# Patient Record
Sex: Female | Born: 1955
Health system: Southern US, Community
[De-identification: ages and names within clinical notes are randomized; demographics above are authoritative.]

## PROBLEM LIST (undated history)

## (undated) DIAGNOSIS — J309 Allergic rhinitis, unspecified: Secondary | ICD-10-CM

## (undated) DIAGNOSIS — E079 Disorder of thyroid, unspecified: Secondary | ICD-10-CM

## (undated) DIAGNOSIS — J45909 Unspecified asthma, uncomplicated: Secondary | ICD-10-CM

## (undated) DIAGNOSIS — I10 Essential (primary) hypertension: Secondary | ICD-10-CM

## (undated) HISTORY — PX: ABDOMINAL HYSTERECTOMY: SHX81

## (undated) HISTORY — DX: Allergic rhinitis, unspecified: J30.9

## (undated) HISTORY — DX: Unspecified asthma, uncomplicated: J45.909

## (undated) HISTORY — PX: JOINT REPLACEMENT: SHX530

---

## 2003-07-10 ENCOUNTER — Encounter: Admission: RE | Admit: 2003-07-10 | Discharge: 2003-10-08 | Payer: Self-pay | Admitting: Internal Medicine

## 2008-06-20 ENCOUNTER — Encounter: Admission: RE | Admit: 2008-06-20 | Discharge: 2008-06-20 | Payer: Self-pay | Admitting: Obstetrics and Gynecology

## 2009-07-25 ENCOUNTER — Encounter: Admission: RE | Admit: 2009-07-25 | Discharge: 2009-07-25 | Payer: Self-pay | Admitting: Obstetrics and Gynecology

## 2010-08-07 ENCOUNTER — Encounter
Admission: RE | Admit: 2010-08-07 | Discharge: 2010-08-07 | Payer: Self-pay | Source: Home / Self Care | Attending: Obstetrics and Gynecology | Admitting: Obstetrics and Gynecology

## 2010-09-14 ENCOUNTER — Encounter: Payer: Self-pay | Admitting: Obstetrics and Gynecology

## 2011-12-02 ENCOUNTER — Encounter (HOSPITAL_BASED_OUTPATIENT_CLINIC_OR_DEPARTMENT_OTHER): Payer: Self-pay | Admitting: *Deleted

## 2011-12-02 ENCOUNTER — Emergency Department (HOSPITAL_BASED_OUTPATIENT_CLINIC_OR_DEPARTMENT_OTHER)
Admission: EM | Admit: 2011-12-02 | Discharge: 2011-12-02 | Disposition: A | Discharge status: Home / Self Care | Payer: No Typology Code available for payment source | Attending: Emergency Medicine | Admitting: Emergency Medicine

## 2011-12-02 DIAGNOSIS — S339XXA Sprain of unspecified parts of lumbar spine and pelvis, initial encounter: Secondary | ICD-10-CM | POA: Insufficient documentation

## 2011-12-02 DIAGNOSIS — E119 Type 2 diabetes mellitus without complications: Secondary | ICD-10-CM | POA: Insufficient documentation

## 2011-12-02 DIAGNOSIS — I10 Essential (primary) hypertension: Secondary | ICD-10-CM | POA: Insufficient documentation

## 2011-12-02 DIAGNOSIS — Y93I9 Activity, other involving external motion: Secondary | ICD-10-CM | POA: Insufficient documentation

## 2011-12-02 DIAGNOSIS — S39012A Strain of muscle, fascia and tendon of lower back, initial encounter: Secondary | ICD-10-CM

## 2011-12-02 DIAGNOSIS — Y998 Other external cause status: Secondary | ICD-10-CM | POA: Insufficient documentation

## 2011-12-02 DIAGNOSIS — E079 Disorder of thyroid, unspecified: Secondary | ICD-10-CM | POA: Insufficient documentation

## 2011-12-02 HISTORY — DX: Disorder of thyroid, unspecified: E07.9

## 2011-12-02 HISTORY — DX: Essential (primary) hypertension: I10

## 2011-12-02 MED ORDER — IBUPROFEN 800 MG PO TABS
800.0000 mg | ORAL_TABLET | Freq: Once | ORAL | Status: AC
  Administered 2011-12-02: 800 mg via ORAL
  Filled 2011-12-02: qty 1

## 2011-12-02 MED ORDER — IBUPROFEN 800 MG PO TABS: 800.0000 mg | ORAL_TABLET | Freq: Three times a day (TID) | ORAL | Status: AC

## 2011-12-02 MED ORDER — TRAMADOL HCL 50 MG PO TABS: 50.0000 mg | ORAL_TABLET | Freq: Four times a day (QID) | ORAL | Status: AC | PRN

## 2011-12-02 MED ORDER — OXYCODONE-ACETAMINOPHEN 5-325 MG PO TABS
2.0000 | ORAL_TABLET | Freq: Once | ORAL | Status: DC
  Filled 2011-12-02: qty 2

## 2011-12-02 MED ORDER — CYCLOBENZAPRINE HCL 10 MG PO TABS: 10.0000 mg | ORAL_TABLET | Freq: Three times a day (TID) | ORAL | Status: AC | PRN

## 2011-12-02 MED ORDER — ALUM & MAG HYDROXIDE-SIMETH 200-200-20 MG/5ML PO SUSP
30.0000 mL | Freq: Once | ORAL | Status: AC
  Administered 2011-12-02: 30 mL via ORAL
  Filled 2011-12-02: qty 30
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
## 2011-12-02 NOTE — Discharge Instructions (Signed)
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
## 2012-04-01 ENCOUNTER — Other Ambulatory Visit: Payer: Self-pay | Admitting: Obstetrics and Gynecology

## 2012-04-01 DIAGNOSIS — Z1231 Encounter for screening mammogram for malignant neoplasm of breast: Secondary | ICD-10-CM

## 2012-04-07 ENCOUNTER — Other Ambulatory Visit (HOSPITAL_COMMUNITY)
Admission: RE | Admit: 2012-04-07 | Discharge: 2012-04-07 | Disposition: A | Discharge status: Home / Self Care | Payer: 59 | Source: Ambulatory Visit | Attending: Obstetrics and Gynecology | Admitting: Obstetrics and Gynecology

## 2012-04-07 DIAGNOSIS — R1909 Other intra-abdominal and pelvic swelling, mass and lump: Secondary | ICD-10-CM | POA: Insufficient documentation

## 2012-04-27 ENCOUNTER — Ambulatory Visit
Admission: RE | Admit: 2012-04-27 | Discharge: 2012-04-27 | Disposition: A | Discharge status: Home / Self Care | Payer: 59 | Source: Ambulatory Visit | Attending: Obstetrics and Gynecology | Admitting: Obstetrics and Gynecology

## 2012-04-27 DIAGNOSIS — Z1231 Encounter for screening mammogram for malignant neoplasm of breast: Secondary | ICD-10-CM

## 2013-07-31 ENCOUNTER — Other Ambulatory Visit: Payer: Self-pay | Admitting: Obstetrics and Gynecology

## 2013-07-31 DIAGNOSIS — Z780 Asymptomatic menopausal state: Secondary | ICD-10-CM

## 2013-07-31 DIAGNOSIS — Z1231 Encounter for screening mammogram for malignant neoplasm of breast: Secondary | ICD-10-CM

## 2013-09-04 ENCOUNTER — Ambulatory Visit
Admission: RE | Admit: 2013-09-04 | Discharge: 2013-09-04 | Disposition: A | Discharge status: Home / Self Care | Payer: 59 | Source: Ambulatory Visit | Attending: Obstetrics and Gynecology | Admitting: Obstetrics and Gynecology

## 2013-09-04 DIAGNOSIS — Z780 Asymptomatic menopausal state: Secondary | ICD-10-CM

## 2013-09-04 DIAGNOSIS — Z1231 Encounter for screening mammogram for malignant neoplasm of breast: Secondary | ICD-10-CM

## 2015-05-22 ENCOUNTER — Ambulatory Visit (INDEPENDENT_AMBULATORY_CARE_PROVIDER_SITE_OTHER): Payer: 59 | Admitting: *Deleted

## 2015-05-22 DIAGNOSIS — J309 Allergic rhinitis, unspecified: Secondary | ICD-10-CM | POA: Diagnosis not present

## 2015-05-22 MED ORDER — EPINEPHRINE 0.3 MG/0.3ML IJ SOAJ: 0.3000 mg | Freq: Once | INTRAMUSCULAR | Status: DC

## 2015-05-30 ENCOUNTER — Ambulatory Visit (INDEPENDENT_AMBULATORY_CARE_PROVIDER_SITE_OTHER): Payer: Self-pay

## 2015-05-30 DIAGNOSIS — J309 Allergic rhinitis, unspecified: Secondary | ICD-10-CM

## 2015-06-20 ENCOUNTER — Ambulatory Visit (INDEPENDENT_AMBULATORY_CARE_PROVIDER_SITE_OTHER): Payer: 59 | Admitting: *Deleted

## 2015-06-20 DIAGNOSIS — J309 Allergic rhinitis, unspecified: Secondary | ICD-10-CM | POA: Diagnosis not present

## 2015-07-04 ENCOUNTER — Ambulatory Visit (INDEPENDENT_AMBULATORY_CARE_PROVIDER_SITE_OTHER): Payer: 59 | Admitting: *Deleted

## 2015-07-04 DIAGNOSIS — J309 Allergic rhinitis, unspecified: Secondary | ICD-10-CM | POA: Diagnosis not present

## 2015-07-16 ENCOUNTER — Ambulatory Visit (INDEPENDENT_AMBULATORY_CARE_PROVIDER_SITE_OTHER): Payer: 59 | Admitting: *Deleted

## 2015-07-16 DIAGNOSIS — J309 Allergic rhinitis, unspecified: Secondary | ICD-10-CM | POA: Diagnosis not present

## 2015-08-05 ENCOUNTER — Ambulatory Visit (INDEPENDENT_AMBULATORY_CARE_PROVIDER_SITE_OTHER): Payer: 59

## 2015-08-05 DIAGNOSIS — J309 Allergic rhinitis, unspecified: Secondary | ICD-10-CM

## 2015-08-20 ENCOUNTER — Other Ambulatory Visit: Payer: Self-pay

## 2015-08-20 DIAGNOSIS — Z1231 Encounter for screening mammogram for malignant neoplasm of breast: Secondary | ICD-10-CM

## 2015-08-29 ENCOUNTER — Ambulatory Visit (INDEPENDENT_AMBULATORY_CARE_PROVIDER_SITE_OTHER): Payer: 59 | Admitting: *Deleted

## 2015-08-29 DIAGNOSIS — J309 Allergic rhinitis, unspecified: Secondary | ICD-10-CM

## 2015-09-12 ENCOUNTER — Ambulatory Visit (INDEPENDENT_AMBULATORY_CARE_PROVIDER_SITE_OTHER): Payer: 59 | Admitting: *Deleted

## 2015-09-12 DIAGNOSIS — J309 Allergic rhinitis, unspecified: Secondary | ICD-10-CM | POA: Diagnosis not present

## 2015-09-19 ENCOUNTER — Ambulatory Visit (INDEPENDENT_AMBULATORY_CARE_PROVIDER_SITE_OTHER): Payer: 59 | Admitting: *Deleted

## 2015-09-19 DIAGNOSIS — J309 Allergic rhinitis, unspecified: Secondary | ICD-10-CM | POA: Diagnosis not present

## 2015-09-26 ENCOUNTER — Ambulatory Visit (INDEPENDENT_AMBULATORY_CARE_PROVIDER_SITE_OTHER): Payer: 59 | Admitting: *Deleted

## 2015-09-26 DIAGNOSIS — J309 Allergic rhinitis, unspecified: Secondary | ICD-10-CM | POA: Diagnosis not present

## 2015-10-08 ENCOUNTER — Ambulatory Visit (INDEPENDENT_AMBULATORY_CARE_PROVIDER_SITE_OTHER): Payer: 59

## 2015-10-08 DIAGNOSIS — J309 Allergic rhinitis, unspecified: Secondary | ICD-10-CM | POA: Diagnosis not present

## 2015-10-24 ENCOUNTER — Ambulatory Visit (INDEPENDENT_AMBULATORY_CARE_PROVIDER_SITE_OTHER): Payer: 59

## 2015-10-24 DIAGNOSIS — J309 Allergic rhinitis, unspecified: Secondary | ICD-10-CM

## 2015-10-25 ENCOUNTER — Encounter: Payer: Self-pay | Admitting: Internal Medicine

## 2015-10-25 ENCOUNTER — Ambulatory Visit (INDEPENDENT_AMBULATORY_CARE_PROVIDER_SITE_OTHER): Payer: 59 | Admitting: Internal Medicine

## 2015-10-25 VITALS — BP 148/100 | HR 84 | Temp 98.7°F | Resp 16 | Ht 64.17 in | Wt 183.0 lb

## 2015-10-25 DIAGNOSIS — J3089 Other allergic rhinitis: Secondary | ICD-10-CM

## 2015-10-25 DIAGNOSIS — J309 Allergic rhinitis, unspecified: Secondary | ICD-10-CM | POA: Insufficient documentation

## 2015-10-25 DIAGNOSIS — J4520 Mild intermittent asthma, uncomplicated: Secondary | ICD-10-CM

## 2015-10-25 DIAGNOSIS — J4530 Mild persistent asthma, uncomplicated: Secondary | ICD-10-CM | POA: Insufficient documentation

## 2015-10-25 MED ORDER — ALBUTEROL SULFATE HFA 108 (90 BASE) MCG/ACT IN AERS: 2.0000 | INHALATION_SPRAY | RESPIRATORY_TRACT | Status: DC | PRN

## 2015-10-25 MED ORDER — FLUNISOLIDE 25 MCG/ACT (0.025%) NA SOLN: NASAL | Status: DC

## 2015-10-25 NOTE — Assessment & Plan Note (Signed)
   On immunotherapy, currently not well controlled  Use Claritin (loratadine) 10 mg daily and start flunisolide one to 2 sprays each nostril daily on a regular basis for the next few weeks  If doing better after that, may try as needed  Has EpiPen and action plan-educated on use

## 2015-10-25 NOTE — Patient Instructions (Signed)
Allergic rhinitis  On immunotherapy, currently not well controlled  Use Claritin (loratadine) 10 mg daily and start flunisolide one to 2 sprays each nostril daily on a regular basis for the next few weeks  If doing better after that, may try as needed  Has EpiPen and action plan-educated on use  Asthma  Intermittent, currently well controlled  Continue as needed Ventolin

## 2015-10-25 NOTE — Progress Notes (Signed)
History of Present Illness: Robin Mcclain is a 60 y.o. female presenting for follow-up.  HPI Comments: Allergic rhinitis on immunotherapy: Start 11/21/12. She is very close to maintenance. She is noticing symptom improvement with decreased infection since starting her injections. Occasionally she has large local reactions but no systemic reactions. For the past several weeks, she has been having headache, rhinorrhea.  She only uses Claritin on the day of her injection.  Asthma: Since her last visit symptoms have been stable. She rarely requires albuterol.   Current Outpatient Prescriptions on File Prior to Visit  Medication Sig Dispense Refill  . albuterol (VENTOLIN HFA) 108 (90 BASE) MCG/ACT inhaler Inhale 2 puffs into the lungs every 4 (four) hours as needed for wheezing or shortness of breath.    Marland Kitchen aspirin 81 MG tablet Take 81 mg by mouth daily.    Marland Kitchen dicyclomine (BENTYL) 10 MG capsule Take 10 mg by mouth 4 (four) times daily -  before meals and at bedtime.    Marland Kitchen EPINEPHrine (EPIPEN 2-PAK) 0.3 mg/0.3 mL IJ SOAJ injection Inject 0.3 mLs (0.3 mg total) into the muscle once. 1 Device 1  . levothyroxine (SYNTHROID, LEVOTHROID) 75 MCG tablet Take 75 mcg by mouth daily.    Marland Kitchen loratadine (CLARITIN) 10 MG tablet Take 10 mg by mouth daily.    . metFORMIN (GLUCOPHAGE) 1000 MG tablet Take 1,000 mg by mouth 2 (two) times daily with a meal.    . olmesartan (BENICAR) 40 MG tablet Take 40 mg by mouth daily.    . rosuvastatin (CRESTOR) 10 MG tablet Take 10 mg by mouth daily.     No current facility-administered medications on file prior to visit.    Assessment and Plan: Allergic rhinitis  On immunotherapy, currently not well controlled  Use Claritin (loratadine) 10 mg daily and start flunisolide one to 2 sprays each nostril daily on a regular basis for the next few weeks  If doing better after that, may try as needed  Has EpiPen and action plan-educated on use  Asthma  Intermittent, currently  well controlled  Continue as needed Ventolin   Return in about 1 year (around 10/24/2016).  Meds ordered this encounter  Medications  . DISCONTD: olmesartan-hydrochlorothiazide (BENICAR HCT) 40-25 MG tablet    Sig:   . glucose blood (FREESTYLE LITE) test strip    Sig:   . ibuprofen (ADVIL,MOTRIN) 800 MG tablet    Sig: Take 800 mg by mouth.  . ondansetron (ZOFRAN) 8 MG tablet    Sig: Take 8 mg by mouth.  Marland Kitchen glimepiride (AMARYL) 4 MG tablet    Sig: TK 1 T PO  BID    Refill:  2  . NONFORMULARY OR COMPOUNDED ITEM    Sig:     Diagnostics: Spirometry: FEV1 2L or 96%, FEV1/FVC  79%.  This is a normal study.  Physical Exam: BP 148/100 mmHg  Pulse 84  Temp(Src) 98.7 F (37.1 C) (Oral)  Resp 16  Ht 5' 4.17" (1.63 m)  Wt 182 lb 15.7 oz (83 kg)  BMI 31.24 kg/m2   Physical Exam  Constitutional: She appears well-developed and well-nourished. No distress.  HENT:  Right Ear: External ear normal.  Left Ear: External ear normal.  Nose: Nose normal.  Mouth/Throat: Oropharynx is clear and moist.  Eyes: Conjunctivae are normal. Right eye exhibits no discharge. Left eye exhibits no discharge.  Cardiovascular: Normal rate, regular rhythm and normal heart sounds.   No murmur heard. Pulmonary/Chest: Effort normal and breath sounds normal. No  respiratory distress. She has no wheezes. She has no rales.  Abdominal: Soft. Bowel sounds are normal.  Musculoskeletal: She exhibits no edema.  Lymphadenopathy:    She has no cervical adenopathy.  Neurological: She is alert.  Skin: No rash noted.  Vitals reviewed.   Drug Allergies:  Allergies  Allergen Reactions  . Lac Bovis   . Lipitor [Atorvastatin]     ROS: Per HPI unless specifically indicated below Review of Systems  Thank you for the opportunity to care for this patient.  Please do not hesitate to contact me with questions.

## 2015-10-25 NOTE — Assessment & Plan Note (Signed)
   Intermittent, currently well controlled  Continue as needed Ventolin

## 2015-10-28 ENCOUNTER — Encounter: Payer: Self-pay | Admitting: *Deleted

## 2015-10-31 ENCOUNTER — Ambulatory Visit (INDEPENDENT_AMBULATORY_CARE_PROVIDER_SITE_OTHER): Payer: 59

## 2015-10-31 DIAGNOSIS — J309 Allergic rhinitis, unspecified: Secondary | ICD-10-CM | POA: Diagnosis not present

## 2015-11-21 ENCOUNTER — Ambulatory Visit (INDEPENDENT_AMBULATORY_CARE_PROVIDER_SITE_OTHER): Payer: 59 | Admitting: *Deleted

## 2015-11-21 DIAGNOSIS — J309 Allergic rhinitis, unspecified: Secondary | ICD-10-CM

## 2015-11-27 ENCOUNTER — Ambulatory Visit (INDEPENDENT_AMBULATORY_CARE_PROVIDER_SITE_OTHER): Payer: 59 | Admitting: *Deleted

## 2015-11-27 DIAGNOSIS — J309 Allergic rhinitis, unspecified: Secondary | ICD-10-CM | POA: Diagnosis not present

## 2015-12-12 ENCOUNTER — Ambulatory Visit (INDEPENDENT_AMBULATORY_CARE_PROVIDER_SITE_OTHER): Payer: 59

## 2015-12-12 DIAGNOSIS — J309 Allergic rhinitis, unspecified: Secondary | ICD-10-CM

## 2015-12-18 ENCOUNTER — Ambulatory Visit (INDEPENDENT_AMBULATORY_CARE_PROVIDER_SITE_OTHER): Payer: 59

## 2015-12-18 DIAGNOSIS — J309 Allergic rhinitis, unspecified: Secondary | ICD-10-CM | POA: Diagnosis not present

## 2015-12-26 ENCOUNTER — Ambulatory Visit (INDEPENDENT_AMBULATORY_CARE_PROVIDER_SITE_OTHER): Payer: 59

## 2015-12-26 DIAGNOSIS — J309 Allergic rhinitis, unspecified: Secondary | ICD-10-CM

## 2015-12-27 DIAGNOSIS — J301 Allergic rhinitis due to pollen: Secondary | ICD-10-CM | POA: Diagnosis not present

## 2016-01-02 ENCOUNTER — Other Ambulatory Visit: Payer: Self-pay | Admitting: Obstetrics and Gynecology

## 2016-01-02 DIAGNOSIS — Z780 Asymptomatic menopausal state: Secondary | ICD-10-CM

## 2016-01-02 DIAGNOSIS — Z1231 Encounter for screening mammogram for malignant neoplasm of breast: Secondary | ICD-10-CM

## 2016-01-07 ENCOUNTER — Ambulatory Visit (INDEPENDENT_AMBULATORY_CARE_PROVIDER_SITE_OTHER): Payer: 59

## 2016-01-07 DIAGNOSIS — J309 Allergic rhinitis, unspecified: Secondary | ICD-10-CM | POA: Diagnosis not present

## 2016-01-16 ENCOUNTER — Ambulatory Visit (INDEPENDENT_AMBULATORY_CARE_PROVIDER_SITE_OTHER): Payer: 59

## 2016-01-16 ENCOUNTER — Other Ambulatory Visit: Payer: Self-pay

## 2016-01-16 ENCOUNTER — Ambulatory Visit: Payer: Self-pay

## 2016-01-16 DIAGNOSIS — J309 Allergic rhinitis, unspecified: Secondary | ICD-10-CM | POA: Diagnosis not present

## 2016-01-23 ENCOUNTER — Ambulatory Visit (INDEPENDENT_AMBULATORY_CARE_PROVIDER_SITE_OTHER): Payer: 59 | Admitting: *Deleted

## 2016-01-23 DIAGNOSIS — J309 Allergic rhinitis, unspecified: Secondary | ICD-10-CM

## 2016-01-28 ENCOUNTER — Ambulatory Visit
Admission: RE | Admit: 2016-01-28 | Discharge: 2016-01-28 | Disposition: A | Discharge status: Home / Self Care | Payer: 59 | Source: Ambulatory Visit | Attending: Obstetrics and Gynecology | Admitting: Obstetrics and Gynecology

## 2016-01-28 DIAGNOSIS — Z780 Asymptomatic menopausal state: Secondary | ICD-10-CM

## 2016-01-28 DIAGNOSIS — Z1231 Encounter for screening mammogram for malignant neoplasm of breast: Secondary | ICD-10-CM

## 2016-02-10 ENCOUNTER — Ambulatory Visit (INDEPENDENT_AMBULATORY_CARE_PROVIDER_SITE_OTHER): Payer: 59

## 2016-02-10 DIAGNOSIS — J309 Allergic rhinitis, unspecified: Secondary | ICD-10-CM

## 2016-03-04 ENCOUNTER — Ambulatory Visit (INDEPENDENT_AMBULATORY_CARE_PROVIDER_SITE_OTHER): Payer: 59

## 2016-03-04 DIAGNOSIS — J309 Allergic rhinitis, unspecified: Secondary | ICD-10-CM | POA: Diagnosis not present

## 2016-03-19 ENCOUNTER — Ambulatory Visit (INDEPENDENT_AMBULATORY_CARE_PROVIDER_SITE_OTHER): Payer: 59 | Admitting: *Deleted

## 2016-03-19 DIAGNOSIS — J309 Allergic rhinitis, unspecified: Secondary | ICD-10-CM | POA: Diagnosis not present

## 2016-04-15 ENCOUNTER — Ambulatory Visit (INDEPENDENT_AMBULATORY_CARE_PROVIDER_SITE_OTHER): Payer: 59

## 2016-04-15 DIAGNOSIS — J309 Allergic rhinitis, unspecified: Secondary | ICD-10-CM

## 2016-04-22 ENCOUNTER — Ambulatory Visit (INDEPENDENT_AMBULATORY_CARE_PROVIDER_SITE_OTHER): Payer: 59

## 2016-04-22 DIAGNOSIS — J309 Allergic rhinitis, unspecified: Secondary | ICD-10-CM

## 2016-05-06 ENCOUNTER — Ambulatory Visit (INDEPENDENT_AMBULATORY_CARE_PROVIDER_SITE_OTHER): Payer: 59 | Admitting: *Deleted

## 2016-05-06 DIAGNOSIS — J309 Allergic rhinitis, unspecified: Secondary | ICD-10-CM | POA: Diagnosis not present

## 2016-05-13 ENCOUNTER — Ambulatory Visit (INDEPENDENT_AMBULATORY_CARE_PROVIDER_SITE_OTHER): Payer: 59

## 2016-05-13 DIAGNOSIS — J309 Allergic rhinitis, unspecified: Secondary | ICD-10-CM | POA: Diagnosis not present

## 2016-05-14 ENCOUNTER — Ambulatory Visit (INDEPENDENT_AMBULATORY_CARE_PROVIDER_SITE_OTHER): Payer: 59 | Admitting: Allergy and Immunology

## 2016-05-14 ENCOUNTER — Encounter: Payer: Self-pay | Admitting: Allergy and Immunology

## 2016-05-14 VITALS — BP 126/60 | HR 86 | Temp 98.2°F | Resp 16

## 2016-05-14 DIAGNOSIS — J3089 Other allergic rhinitis: Secondary | ICD-10-CM

## 2016-05-14 DIAGNOSIS — J4520 Mild intermittent asthma, uncomplicated: Secondary | ICD-10-CM

## 2016-05-14 NOTE — Patient Instructions (Signed)
Allergic rhinitis  Continue appropriate allergen avoidance measures and flunisolide nasal spray.  Due to potential for tachyphylaxis, I have recommended switching from loratadine to fexofenadine.  Asthma  Continue albuterol HFA, 1-2 inhalations every 4-6 hours as needed.  Subjective and objective measures of pulmonary function will be followed and the treatment plan will be adjusted accordingly.   Return in about 6 months (around 11/11/2016), or if symptoms worsen or fail to improve.

## 2016-05-14 NOTE — Assessment & Plan Note (Signed)
   Continue appropriate allergen avoidance measures and flunisolide nasal spray.  Due to potential for tachyphylaxis, I have recommended switching from loratadine to fexofenadine.

## 2016-05-14 NOTE — Progress Notes (Signed)
Follow-up Note  RE: [name] MRN: [id number] DOB: 02-03-1956 Date of Office Visit: 05/14/2016  Primary care provider: Kristian Covey, MD Referring provider: Kristian Covey, MD  History of present illness: Robin Mcclain is a 60 y.o. female with allergic rhinitis and asthma presenting today for follow up.  She was last seen in this clinic on 10/25/2015 by Dr. Emilio Math who has since left this practice.  She reports that her upper and lower rest or symptoms have been well-controlled in the interval since her previous visit.  She rarely requires albuterol rescue, typically when she is exposed to strong aromas such as perfumes.  She occasionally experiences sinus headaches due to rapid weather changes, however the symptoms are typically well-controlled with loratadine or flunisolide nasal spray.   Assessment and plan: Allergic rhinitis  Continue appropriate allergen avoidance measures and flunisolide nasal spray.  Due to potential for tachyphylaxis, I have recommended switching from loratadine to fexofenadine.  Asthma  Continue albuterol HFA, 1-2 inhalations every 4-6 hours as needed.  Subjective and objective measures of pulmonary function will be followed and the treatment plan will be adjusted accordingly.   Diagnositics: Spirometry:  Normal with an FEV1 of 90% predicted.  Please see scanned spirometry results for details.    Physical examination: Blood pressure 126/60, pulse 86, temperature 98.2 F (36.8 C), temperature source Oral, resp. rate 16.  General: Alert, interactive, in no acute distress. HEENT: TMs pearly gray, turbinates minimally edematous without discharge, post-pharynx mildly erythematous. Neck: Supple without lymphadenopathy. Lungs: Clear to auscultation without wheezing, rhonchi or rales. CV: Normal S1, S2 without murmurs. Skin: Warm and dry, without lesions or rashes.  The following portions of the patient's history were reviewed and updated as  appropriate: allergies, current medications, past family history, past medical history, past social history, past surgical history and problem list.    Medication List       Accurate as of 05/14/16  4:13 PM. Always use your most recent med list.          albuterol 108 (90 Base) MCG/ACT inhaler Commonly known as:  VENTOLIN HFA Inhale 2 puffs into the lungs every 4 (four) hours as needed for wheezing or shortness of breath.   dicyclomine 20 MG tablet Commonly known as:  BENTYL Take 20 mg by mouth.   EPINEPHrine 0.3 mg/0.3 mL Soaj injection Commonly known as:  EPIPEN 2-PAK Inject 0.3 mLs (0.3 mg total) into the muscle once.   flunisolide 25 MCG/ACT (0.025%) Soln Commonly known as:  NASALIDE Use 1 to 2 sprays each nostril once daily for stuffy nose or drainage   glimepiride 4 MG tablet Commonly known as:  AMARYL TK 1 T PO  BID   GOODSENSE ASPIRIN 81 MG chewable tablet Generic drug:  aspirin Chew 81 mg by mouth.   ibuprofen 800 MG tablet Commonly known as:  ADVIL,MOTRIN Take 800 mg by mouth.   loratadine 10 MG tablet Commonly known as:  CLARITIN Take 10 mg by mouth daily.   metFORMIN 1000 MG tablet Commonly known as:  GLUCOPHAGE TAKE 1 TABLET BY MOUTH TWICE DAILY WITH MEALS   NON FORMULARY   NONFORMULARY OR COMPOUNDED ITEM   olmesartan 40 MG tablet Commonly known as:  BENICAR Take 40 mg by mouth daily.   ondansetron 8 MG tablet Commonly known as:  ZOFRAN Take 8 mg by mouth.   rosuvastatin 10 MG tablet Commonly known as:  CRESTOR TAKE 1 TABLET(10 MG) BY MOUTH DAILY   SYNTHROID 75 MCG tablet Generic drug:  levothyroxine TAKE 1 TABLET BY MOUTH DAILY       Allergies  Allergen Reactions  . Lac Bovis   . Lipitor [Atorvastatin]     I appreciate the opportunity to take part in [place] care. Please do not hesitate to contact me with questions.  Sincerely,   R. Edgar Frisk, MD

## 2016-05-14 NOTE — Assessment & Plan Note (Signed)
   Continue albuterol HFA, 1-2 inhalations every 4-6 hours as needed.  Subjective and objective measures of pulmonary function will be followed and the treatment plan will be adjusted accordingly. 

## 2016-05-21 ENCOUNTER — Ambulatory Visit (INDEPENDENT_AMBULATORY_CARE_PROVIDER_SITE_OTHER): Payer: 59

## 2016-05-21 DIAGNOSIS — J309 Allergic rhinitis, unspecified: Secondary | ICD-10-CM | POA: Diagnosis not present

## 2016-05-28 ENCOUNTER — Ambulatory Visit (INDEPENDENT_AMBULATORY_CARE_PROVIDER_SITE_OTHER): Payer: 59

## 2016-05-28 DIAGNOSIS — J309 Allergic rhinitis, unspecified: Secondary | ICD-10-CM | POA: Diagnosis not present

## 2016-06-11 ENCOUNTER — Ambulatory Visit (INDEPENDENT_AMBULATORY_CARE_PROVIDER_SITE_OTHER): Payer: 59

## 2016-06-11 DIAGNOSIS — J309 Allergic rhinitis, unspecified: Secondary | ICD-10-CM

## 2016-07-13 ENCOUNTER — Ambulatory Visit (INDEPENDENT_AMBULATORY_CARE_PROVIDER_SITE_OTHER): Payer: 59

## 2016-07-13 DIAGNOSIS — J309 Allergic rhinitis, unspecified: Secondary | ICD-10-CM

## 2016-08-27 DIAGNOSIS — J301 Allergic rhinitis due to pollen: Secondary | ICD-10-CM | POA: Diagnosis not present

## 2016-09-01 NOTE — Addendum Note (Signed)
Addended by: Felipa Emory on: 09/01/2016 03:09 PM   Modules accepted: Orders

## 2016-09-03 ENCOUNTER — Ambulatory Visit (INDEPENDENT_AMBULATORY_CARE_PROVIDER_SITE_OTHER): Payer: 59

## 2016-09-03 DIAGNOSIS — J309 Allergic rhinitis, unspecified: Secondary | ICD-10-CM

## 2016-09-23 ENCOUNTER — Ambulatory Visit (INDEPENDENT_AMBULATORY_CARE_PROVIDER_SITE_OTHER): Payer: 59

## 2016-09-23 DIAGNOSIS — J309 Allergic rhinitis, unspecified: Secondary | ICD-10-CM | POA: Diagnosis not present

## 2016-10-01 ENCOUNTER — Ambulatory Visit (INDEPENDENT_AMBULATORY_CARE_PROVIDER_SITE_OTHER): Payer: 59

## 2016-10-01 DIAGNOSIS — J309 Allergic rhinitis, unspecified: Secondary | ICD-10-CM | POA: Diagnosis not present

## 2016-10-22 ENCOUNTER — Ambulatory Visit (INDEPENDENT_AMBULATORY_CARE_PROVIDER_SITE_OTHER): Payer: 59 | Admitting: *Deleted

## 2016-10-22 DIAGNOSIS — J309 Allergic rhinitis, unspecified: Secondary | ICD-10-CM

## 2016-11-12 ENCOUNTER — Ambulatory Visit (INDEPENDENT_AMBULATORY_CARE_PROVIDER_SITE_OTHER): Payer: 59 | Admitting: *Deleted

## 2016-11-12 DIAGNOSIS — J309 Allergic rhinitis, unspecified: Secondary | ICD-10-CM

## 2016-12-03 ENCOUNTER — Ambulatory Visit (INDEPENDENT_AMBULATORY_CARE_PROVIDER_SITE_OTHER): Payer: 59

## 2016-12-03 DIAGNOSIS — J309 Allergic rhinitis, unspecified: Secondary | ICD-10-CM

## 2016-12-10 ENCOUNTER — Ambulatory Visit (INDEPENDENT_AMBULATORY_CARE_PROVIDER_SITE_OTHER): Payer: 59

## 2016-12-10 DIAGNOSIS — J309 Allergic rhinitis, unspecified: Secondary | ICD-10-CM | POA: Diagnosis not present

## 2016-12-14 ENCOUNTER — Ambulatory Visit (INDEPENDENT_AMBULATORY_CARE_PROVIDER_SITE_OTHER): Payer: 59

## 2016-12-14 DIAGNOSIS — J309 Allergic rhinitis, unspecified: Secondary | ICD-10-CM

## 2016-12-24 ENCOUNTER — Ambulatory Visit (INDEPENDENT_AMBULATORY_CARE_PROVIDER_SITE_OTHER): Payer: 59

## 2016-12-24 DIAGNOSIS — J309 Allergic rhinitis, unspecified: Secondary | ICD-10-CM

## 2017-01-06 ENCOUNTER — Other Ambulatory Visit: Payer: Self-pay

## 2017-01-06 MED ORDER — FLUNISOLIDE 25 MCG/ACT (0.025%) NA SOLN: NASAL | 0 refills | Status: DC

## 2017-01-06 NOTE — Telephone Encounter (Signed)
Refill flunisolide nasal soln x 1. Per chart note 05/14/16, Return in about 6 months (around 11/11/2016). Patient needs ov.

## 2017-01-07 ENCOUNTER — Ambulatory Visit: Payer: Self-pay

## 2017-01-07 ENCOUNTER — Encounter: Payer: Self-pay | Admitting: Allergy and Immunology

## 2017-01-07 ENCOUNTER — Ambulatory Visit (INDEPENDENT_AMBULATORY_CARE_PROVIDER_SITE_OTHER): Payer: 59 | Admitting: Allergy and Immunology

## 2017-01-07 VITALS — BP 144/96 | HR 87 | Temp 98.4°F | Resp 16

## 2017-01-07 DIAGNOSIS — J309 Allergic rhinitis, unspecified: Secondary | ICD-10-CM

## 2017-01-07 DIAGNOSIS — R05 Cough: Secondary | ICD-10-CM | POA: Diagnosis not present

## 2017-01-07 DIAGNOSIS — J3089 Other allergic rhinitis: Secondary | ICD-10-CM

## 2017-01-07 DIAGNOSIS — R053 Chronic cough: Secondary | ICD-10-CM | POA: Insufficient documentation

## 2017-01-07 DIAGNOSIS — J4531 Mild persistent asthma with (acute) exacerbation: Secondary | ICD-10-CM | POA: Diagnosis not present

## 2017-01-07 MED ORDER — ALBUTEROL SULFATE HFA 108 (90 BASE) MCG/ACT IN AERS: 2.0000 | INHALATION_SPRAY | RESPIRATORY_TRACT | 1 refills | Status: DC | PRN

## 2017-01-07 MED ORDER — MONTELUKAST SODIUM 10 MG PO TABS: 10.0000 mg | ORAL_TABLET | Freq: Every day | ORAL | 5 refills | Status: DC

## 2017-01-07 NOTE — Patient Instructions (Addendum)
Allergic rhinitis  Continue appropriate allergen avoidance measures, aeroallergen immunotherapy, and flunisolide nasal spray.  I recommended more regular use of flunisolide nasal spray during the pollen season.  I have also recommended nasal saline spray (i.e., Simply Saline) or nasal saline lavage (i.e., NeilMed) as needed and prior to medicated nasal sprays.  A prescription has been provided for montelukast 10 mg daily at bedtime.  Consider switching to fexofenadine if benefit from loratadine seems to diminish with daily use.  Mild persistent asthma  Montelukast has been prescribed (as above).  Continue albuterol HFA, 1-2 inhalations every 4-6 hours as needed.  The patient has been asked to contact me if her symptoms persist or progress. Otherwise, she may return for follow up in 6 months.  Persistent cough Is likely related to a combination of allergic rhinitis and asthma in context of elevated pollen counts.  Treatment plan as outlined above.   Return in about 6 months (around 07/10/2017), or if symptoms worsen or fail to improve.

## 2017-01-07 NOTE — Assessment & Plan Note (Addendum)
   Montelukast has been prescribed (as above).  Continue albuterol HFA, 1-2 inhalations every 4-6 hours as needed.  The patient has been asked to contact me if her symptoms persist or progress. Otherwise, she may return for follow up in 6 months.

## 2017-01-07 NOTE — Assessment & Plan Note (Signed)
Is likely related to a combination of allergic rhinitis and asthma in context of elevated pollen counts.  Treatment plan as outlined above.

## 2017-01-07 NOTE — Progress Notes (Signed)
Follow-up Note  RE: Robin Mcclain MRN: [id number] DOB: [birth year] Date of Office Visit: 01/07/2017  Primary care provider: Kristian Covey, MD Referring provider: Kristian Covey, MD  History of present illness: Robin Mcclain is a 61 y.o. female with allergic rhinitis and asthma presented today for follow up.  She was last seen in this clinic in September 2017.  Over the past month, she has been experiencing a dry persistent cough.  Her asthma symptoms are typically triggered by heat, humidity, and strong aromas such as perfumes and colognes.  Over this past week, she has been experiencing sore throat, though she is unclear if this is due to pollen exposure or a viral upper respiratory tract infection.  She admits that she is only been using flunisolide nasal spray occasionally.  She is tolerating aeroallergen immunotherapy injections without problems or complications.   Assessment and plan: Allergic rhinitis  Continue appropriate allergen avoidance measures, aeroallergen immunotherapy, and flunisolide nasal spray.  I recommended more regular use of flunisolide nasal spray during the pollen season.  I have also recommended nasal saline spray (i.e., Simply Saline) or nasal saline lavage (i.e., NeilMed) as needed and prior to medicated nasal sprays.  A prescription has been provided for montelukast 10 mg daily at bedtime.  Consider switching to fexofenadine if benefit from loratadine seems to diminish with daily use.  Mild persistent asthma  Montelukast has been prescribed (as above).  Continue albuterol HFA, 1-2 inhalations every 4-6 hours as needed.  The patient has been asked to contact me if her symptoms persist or progress. Otherwise, she may return for follow up in 6 months.  Persistent cough Is likely related to a combination of allergic rhinitis and asthma in context of elevated pollen counts.  Treatment plan as outlined above.   Meds ordered this encounter    Medications  . albuterol (VENTOLIN HFA) 108 (90 Base) MCG/ACT inhaler    Sig: Inhale 2 puffs into the lungs every 4 (four) hours as needed for wheezing or shortness of breath.    Dispense:  1 Inhaler    Refill:  1  . montelukast (SINGULAIR) 10 MG tablet    Sig: Take 1 tablet (10 mg total) by mouth at bedtime.    Dispense:  34 tablet    Refill:  5    Diagnostics: Spirometry:  Normal with an FEV1 of 91% predicted.  This is consistent with previous studies.  Please see scanned spirometry results for details.    Physical examination: Blood pressure (!) 144/96, pulse 87, temperature 98.4 F (36.9 C), temperature source Oral, resp. rate 16, SpO2 96 %.  General: Alert, interactive, in no acute distress. HEENT: TMs pearly gray, turbinates moderately edematous without discharge, post-pharynx moderately erythematous. Neck: Supple without lymphadenopathy. Lungs: Clear to auscultation without wheezing, rhonchi or rales. CV: Normal S1, S2 without murmurs. Skin: Warm and dry, without lesions or rashes.  The following portions of the patient's history were reviewed and updated as appropriate: allergies, current medications, past family history, past medical history, past social history, past surgical history and problem list.  Allergies as of 01/07/2017      Reactions   Lac Bovis    Lipitor [atorvastatin] Other (See Comments)   Muscle pain and fatigue      Medication List       Accurate as of 01/07/17  3:06 PM. Always use your most recent med list.          albuterol 108 (90 Base) MCG/ACT inhaler Commonly known as:  VENTOLIN HFA Inhale 2 puffs into the lungs every 4 (four) hours as needed for wheezing or shortness of breath.   dicyclomine 20 MG tablet Commonly known as:  BENTYL Take 20 mg by mouth.   EPINEPHrine 0.3 mg/0.3 mL Soaj injection Commonly known as:  EPIPEN 2-PAK Inject 0.3 mLs (0.3 mg total) into the muscle once.   flunisolide 25 MCG/ACT (0.025%) Soln Commonly  known as:  NASALIDE Use 1 to 2 sprays each nostril once daily for stuffy nose or drainage   glimepiride 4 MG tablet Commonly known as:  AMARYL TK 1 T PO  BID   GOODSENSE ASPIRIN 81 MG chewable tablet Generic drug:  aspirin Chew 81 mg by mouth.   ibuprofen 800 MG tablet Commonly known as:  ADVIL,MOTRIN Take 800 mg by mouth.   loratadine 10 MG tablet Commonly known as:  CLARITIN Take 10 mg by mouth daily.   meloxicam 7.5 MG tablet Commonly known as:  MOBIC TAKE 1 TABLET(7.5 MG) BY MOUTH DAILY   metFORMIN 1000 MG tablet Commonly known as:  GLUCOPHAGE TAKE 1 TABLET BY MOUTH TWICE DAILY WITH MEALS   montelukast 10 MG tablet Commonly known as:  SINGULAIR Take 1 tablet (10 mg total) by mouth at bedtime.   NON FORMULARY   olmesartan 40 MG tablet Commonly known as:  BENICAR Take 40 mg by mouth daily.   ondansetron 8 MG tablet Commonly known as:  ZOFRAN Take 8 mg by mouth.   rosuvastatin 10 MG tablet Commonly known as:  CRESTOR TAKE 1 TABLET(10 MG) BY MOUTH DAILY   SYNTHROID 75 MCG tablet Generic drug:  levothyroxine TAKE 1 TABLET BY MOUTH DAILY       Allergies  Allergen Reactions  . Lac Bovis   . Lipitor [Atorvastatin] Other (See Comments)    Muscle pain and fatigue    I appreciate the opportunity to take part in [place]'s care. Please do not hesitate to contact me with questions.  Sincerely,   R. Edgar Frisk, MD

## 2017-01-07 NOTE — Assessment & Plan Note (Signed)
   Continue appropriate allergen avoidance measures, aeroallergen immunotherapy, and flunisolide nasal spray.  I recommended more regular use of flunisolide nasal spray during the pollen season.  I have also recommended nasal saline spray (i.e., Simply Saline) or nasal saline lavage (i.e., NeilMed) as needed and prior to medicated nasal sprays.  A prescription has been provided for montelukast 10 mg daily at bedtime.  Consider switching to fexofenadine if benefit from loratadine seems to diminish with daily use.

## 2017-01-28 ENCOUNTER — Ambulatory Visit (INDEPENDENT_AMBULATORY_CARE_PROVIDER_SITE_OTHER): Payer: 59 | Admitting: *Deleted

## 2017-01-28 DIAGNOSIS — J309 Allergic rhinitis, unspecified: Secondary | ICD-10-CM

## 2017-02-04 ENCOUNTER — Ambulatory Visit (INDEPENDENT_AMBULATORY_CARE_PROVIDER_SITE_OTHER): Payer: 59 | Admitting: *Deleted

## 2017-02-04 DIAGNOSIS — J309 Allergic rhinitis, unspecified: Secondary | ICD-10-CM | POA: Diagnosis not present

## 2017-02-25 ENCOUNTER — Ambulatory Visit (INDEPENDENT_AMBULATORY_CARE_PROVIDER_SITE_OTHER): Payer: 59

## 2017-02-25 DIAGNOSIS — J309 Allergic rhinitis, unspecified: Secondary | ICD-10-CM

## 2017-03-11 ENCOUNTER — Ambulatory Visit (INDEPENDENT_AMBULATORY_CARE_PROVIDER_SITE_OTHER): Payer: 59

## 2017-03-11 DIAGNOSIS — J309 Allergic rhinitis, unspecified: Secondary | ICD-10-CM | POA: Diagnosis not present

## 2017-03-16 ENCOUNTER — Ambulatory Visit (INDEPENDENT_AMBULATORY_CARE_PROVIDER_SITE_OTHER): Payer: 59

## 2017-03-16 DIAGNOSIS — J309 Allergic rhinitis, unspecified: Secondary | ICD-10-CM | POA: Diagnosis not present

## 2017-04-22 ENCOUNTER — Ambulatory Visit (INDEPENDENT_AMBULATORY_CARE_PROVIDER_SITE_OTHER): Payer: 59

## 2017-04-22 DIAGNOSIS — J309 Allergic rhinitis, unspecified: Secondary | ICD-10-CM | POA: Diagnosis not present

## 2017-05-06 ENCOUNTER — Ambulatory Visit (INDEPENDENT_AMBULATORY_CARE_PROVIDER_SITE_OTHER): Payer: 59 | Admitting: *Deleted

## 2017-05-06 DIAGNOSIS — J309 Allergic rhinitis, unspecified: Secondary | ICD-10-CM | POA: Diagnosis not present

## 2017-05-19 ENCOUNTER — Ambulatory Visit (INDEPENDENT_AMBULATORY_CARE_PROVIDER_SITE_OTHER): Payer: PRIVATE HEALTH INSURANCE | Admitting: *Deleted

## 2017-05-19 DIAGNOSIS — J309 Allergic rhinitis, unspecified: Secondary | ICD-10-CM

## 2017-06-09 ENCOUNTER — Ambulatory Visit (INDEPENDENT_AMBULATORY_CARE_PROVIDER_SITE_OTHER): Payer: PRIVATE HEALTH INSURANCE

## 2017-06-09 DIAGNOSIS — J309 Allergic rhinitis, unspecified: Secondary | ICD-10-CM | POA: Diagnosis not present

## 2017-06-17 ENCOUNTER — Ambulatory Visit (INDEPENDENT_AMBULATORY_CARE_PROVIDER_SITE_OTHER): Payer: PRIVATE HEALTH INSURANCE

## 2017-06-17 DIAGNOSIS — J309 Allergic rhinitis, unspecified: Secondary | ICD-10-CM

## 2017-06-23 ENCOUNTER — Ambulatory Visit (INDEPENDENT_AMBULATORY_CARE_PROVIDER_SITE_OTHER): Payer: PRIVATE HEALTH INSURANCE

## 2017-06-23 DIAGNOSIS — J309 Allergic rhinitis, unspecified: Secondary | ICD-10-CM | POA: Diagnosis not present

## 2017-06-29 ENCOUNTER — Ambulatory Visit (INDEPENDENT_AMBULATORY_CARE_PROVIDER_SITE_OTHER): Payer: PRIVATE HEALTH INSURANCE | Admitting: *Deleted

## 2017-06-29 DIAGNOSIS — J309 Allergic rhinitis, unspecified: Secondary | ICD-10-CM | POA: Diagnosis not present

## 2017-07-06 ENCOUNTER — Ambulatory Visit (INDEPENDENT_AMBULATORY_CARE_PROVIDER_SITE_OTHER): Payer: PRIVATE HEALTH INSURANCE | Admitting: *Deleted

## 2017-07-06 DIAGNOSIS — J309 Allergic rhinitis, unspecified: Secondary | ICD-10-CM

## 2017-07-08 ENCOUNTER — Ambulatory Visit: Payer: 59 | Admitting: Allergy and Immunology

## 2017-07-12 ENCOUNTER — Ambulatory Visit (INDEPENDENT_AMBULATORY_CARE_PROVIDER_SITE_OTHER): Payer: PRIVATE HEALTH INSURANCE

## 2017-07-12 DIAGNOSIS — J309 Allergic rhinitis, unspecified: Secondary | ICD-10-CM

## 2017-07-27 NOTE — Progress Notes (Signed)
EXP 07/28/18

## 2017-07-28 DIAGNOSIS — J301 Allergic rhinitis due to pollen: Secondary | ICD-10-CM | POA: Diagnosis not present

## 2017-08-04 ENCOUNTER — Ambulatory Visit (INDEPENDENT_AMBULATORY_CARE_PROVIDER_SITE_OTHER): Payer: PRIVATE HEALTH INSURANCE | Admitting: *Deleted

## 2017-08-04 DIAGNOSIS — J309 Allergic rhinitis, unspecified: Secondary | ICD-10-CM

## 2017-08-12 ENCOUNTER — Ambulatory Visit: Payer: Self-pay

## 2017-08-12 DIAGNOSIS — J309 Allergic rhinitis, unspecified: Secondary | ICD-10-CM

## 2017-09-29 ENCOUNTER — Ambulatory Visit (INDEPENDENT_AMBULATORY_CARE_PROVIDER_SITE_OTHER): Payer: PRIVATE HEALTH INSURANCE

## 2017-09-29 DIAGNOSIS — J309 Allergic rhinitis, unspecified: Secondary | ICD-10-CM

## 2017-10-07 ENCOUNTER — Ambulatory Visit (INDEPENDENT_AMBULATORY_CARE_PROVIDER_SITE_OTHER): Payer: PRIVATE HEALTH INSURANCE | Admitting: *Deleted

## 2017-10-07 DIAGNOSIS — J309 Allergic rhinitis, unspecified: Secondary | ICD-10-CM

## 2017-10-14 ENCOUNTER — Ambulatory Visit (INDEPENDENT_AMBULATORY_CARE_PROVIDER_SITE_OTHER): Payer: PRIVATE HEALTH INSURANCE

## 2017-10-14 DIAGNOSIS — J309 Allergic rhinitis, unspecified: Secondary | ICD-10-CM | POA: Diagnosis not present

## 2017-10-20 ENCOUNTER — Other Ambulatory Visit: Payer: Self-pay | Admitting: Obstetrics and Gynecology

## 2017-10-20 ENCOUNTER — Ambulatory Visit
Admission: RE | Admit: 2017-10-20 | Discharge: 2017-10-20 | Disposition: A | Discharge status: Home / Self Care | Payer: PRIVATE HEALTH INSURANCE | Source: Ambulatory Visit | Attending: Obstetrics and Gynecology | Admitting: Obstetrics and Gynecology

## 2017-10-20 DIAGNOSIS — Z1231 Encounter for screening mammogram for malignant neoplasm of breast: Secondary | ICD-10-CM

## 2017-10-20 DIAGNOSIS — E2839 Other primary ovarian failure: Secondary | ICD-10-CM

## 2017-10-28 ENCOUNTER — Other Ambulatory Visit: Payer: PRIVATE HEALTH INSURANCE

## 2017-11-08 ENCOUNTER — Other Ambulatory Visit: Payer: Self-pay | Admitting: Obstetrics and Gynecology

## 2017-11-09 ENCOUNTER — Other Ambulatory Visit: Payer: Self-pay | Admitting: Obstetrics and Gynecology

## 2017-11-09 ENCOUNTER — Other Ambulatory Visit: Payer: Self-pay

## 2017-11-09 DIAGNOSIS — E2839 Other primary ovarian failure: Secondary | ICD-10-CM

## 2017-11-10 ENCOUNTER — Inpatient Hospital Stay
Admission: RE | Admit: 2017-11-10 | Discharge: 2017-11-10 | Disposition: A | Discharge status: Home / Self Care | Payer: PRIVATE HEALTH INSURANCE | Source: Ambulatory Visit | Attending: Obstetrics and Gynecology | Admitting: Obstetrics and Gynecology

## 2017-11-30 ENCOUNTER — Ambulatory Visit: Payer: Self-pay

## 2017-11-30 DIAGNOSIS — J309 Allergic rhinitis, unspecified: Secondary | ICD-10-CM

## 2017-12-01 NOTE — Progress Notes (Signed)
Dilute vial due to time

## 2017-12-02 ENCOUNTER — Ambulatory Visit (INDEPENDENT_AMBULATORY_CARE_PROVIDER_SITE_OTHER): Payer: PRIVATE HEALTH INSURANCE

## 2017-12-02 DIAGNOSIS — J309 Allergic rhinitis, unspecified: Secondary | ICD-10-CM | POA: Diagnosis not present

## 2017-12-09 ENCOUNTER — Ambulatory Visit (INDEPENDENT_AMBULATORY_CARE_PROVIDER_SITE_OTHER): Payer: PRIVATE HEALTH INSURANCE

## 2017-12-09 DIAGNOSIS — J309 Allergic rhinitis, unspecified: Secondary | ICD-10-CM | POA: Diagnosis not present

## 2017-12-14 ENCOUNTER — Ambulatory Visit (INDEPENDENT_AMBULATORY_CARE_PROVIDER_SITE_OTHER): Payer: PRIVATE HEALTH INSURANCE

## 2017-12-14 DIAGNOSIS — J309 Allergic rhinitis, unspecified: Secondary | ICD-10-CM | POA: Diagnosis not present

## 2018-01-06 ENCOUNTER — Ambulatory Visit (INDEPENDENT_AMBULATORY_CARE_PROVIDER_SITE_OTHER): Payer: PRIVATE HEALTH INSURANCE

## 2018-01-06 DIAGNOSIS — J309 Allergic rhinitis, unspecified: Secondary | ICD-10-CM

## 2018-01-13 ENCOUNTER — Ambulatory Visit (INDEPENDENT_AMBULATORY_CARE_PROVIDER_SITE_OTHER): Payer: PRIVATE HEALTH INSURANCE

## 2018-01-13 DIAGNOSIS — J309 Allergic rhinitis, unspecified: Secondary | ICD-10-CM | POA: Diagnosis not present

## 2018-01-20 ENCOUNTER — Ambulatory Visit (INDEPENDENT_AMBULATORY_CARE_PROVIDER_SITE_OTHER): Payer: PRIVATE HEALTH INSURANCE

## 2018-01-20 DIAGNOSIS — J309 Allergic rhinitis, unspecified: Secondary | ICD-10-CM

## 2018-01-26 ENCOUNTER — Ambulatory Visit (INDEPENDENT_AMBULATORY_CARE_PROVIDER_SITE_OTHER): Payer: PRIVATE HEALTH INSURANCE | Admitting: *Deleted

## 2018-01-26 DIAGNOSIS — J309 Allergic rhinitis, unspecified: Secondary | ICD-10-CM | POA: Diagnosis not present

## 2018-01-28 ENCOUNTER — Other Ambulatory Visit: Payer: PRIVATE HEALTH INSURANCE

## 2018-02-02 ENCOUNTER — Ambulatory Visit (INDEPENDENT_AMBULATORY_CARE_PROVIDER_SITE_OTHER): Payer: PRIVATE HEALTH INSURANCE | Admitting: *Deleted

## 2018-02-02 DIAGNOSIS — J309 Allergic rhinitis, unspecified: Secondary | ICD-10-CM

## 2018-02-03 ENCOUNTER — Inpatient Hospital Stay
Admission: RE | Admit: 2018-02-03 | Discharge: 2018-02-03 | Disposition: A | Discharge status: Home / Self Care | Payer: PRIVATE HEALTH INSURANCE | Source: Ambulatory Visit | Attending: Obstetrics and Gynecology | Admitting: Obstetrics and Gynecology

## 2018-03-21 ENCOUNTER — Inpatient Hospital Stay
Admission: RE | Admit: 2018-03-21 | Discharge: 2018-03-21 | Disposition: A | Discharge status: Home / Self Care | Payer: PRIVATE HEALTH INSURANCE | Source: Ambulatory Visit | Attending: Obstetrics and Gynecology | Admitting: Obstetrics and Gynecology

## 2018-03-24 ENCOUNTER — Ambulatory Visit (INDEPENDENT_AMBULATORY_CARE_PROVIDER_SITE_OTHER): Payer: PRIVATE HEALTH INSURANCE

## 2018-03-24 DIAGNOSIS — J309 Allergic rhinitis, unspecified: Secondary | ICD-10-CM | POA: Diagnosis not present

## 2018-04-14 ENCOUNTER — Ambulatory Visit (INDEPENDENT_AMBULATORY_CARE_PROVIDER_SITE_OTHER): Payer: PRIVATE HEALTH INSURANCE | Admitting: *Deleted

## 2018-04-14 DIAGNOSIS — J309 Allergic rhinitis, unspecified: Secondary | ICD-10-CM

## 2018-04-15 ENCOUNTER — Ambulatory Visit
Admission: RE | Admit: 2018-04-15 | Discharge: 2018-04-15 | Disposition: A | Discharge status: Home / Self Care | Payer: PRIVATE HEALTH INSURANCE | Source: Ambulatory Visit | Attending: Obstetrics and Gynecology | Admitting: Obstetrics and Gynecology

## 2018-04-15 DIAGNOSIS — E2839 Other primary ovarian failure: Secondary | ICD-10-CM

## 2018-04-19 ENCOUNTER — Ambulatory Visit: Payer: PRIVATE HEALTH INSURANCE | Admitting: Family Medicine

## 2018-04-19 DIAGNOSIS — J309 Allergic rhinitis, unspecified: Secondary | ICD-10-CM

## 2018-04-21 ENCOUNTER — Ambulatory Visit (INDEPENDENT_AMBULATORY_CARE_PROVIDER_SITE_OTHER): Payer: PRIVATE HEALTH INSURANCE

## 2018-04-21 DIAGNOSIS — J309 Allergic rhinitis, unspecified: Secondary | ICD-10-CM | POA: Diagnosis not present

## 2018-05-04 ENCOUNTER — Ambulatory Visit (INDEPENDENT_AMBULATORY_CARE_PROVIDER_SITE_OTHER): Payer: PRIVATE HEALTH INSURANCE

## 2018-05-04 DIAGNOSIS — J309 Allergic rhinitis, unspecified: Secondary | ICD-10-CM | POA: Diagnosis not present

## 2018-05-12 ENCOUNTER — Ambulatory Visit: Payer: PRIVATE HEALTH INSURANCE | Admitting: Family Medicine

## 2018-05-12 ENCOUNTER — Encounter: Payer: Self-pay | Admitting: Family Medicine

## 2018-05-12 ENCOUNTER — Ambulatory Visit: Payer: Self-pay

## 2018-05-12 VITALS — BP 122/76 | HR 87 | Temp 98.3°F | Resp 16 | Ht 64.0 in | Wt 179.0 lb

## 2018-05-12 DIAGNOSIS — J4531 Mild persistent asthma with (acute) exacerbation: Secondary | ICD-10-CM

## 2018-05-12 DIAGNOSIS — J301 Allergic rhinitis due to pollen: Secondary | ICD-10-CM

## 2018-05-12 DIAGNOSIS — J4530 Mild persistent asthma, uncomplicated: Secondary | ICD-10-CM

## 2018-05-12 MED ORDER — MONTELUKAST SODIUM 10 MG PO TABS: 10.0000 mg | ORAL_TABLET | Freq: Every day | ORAL | 5 refills | Status: DC

## 2018-05-12 MED ORDER — ALBUTEROL SULFATE HFA 108 (90 BASE) MCG/ACT IN AERS: 2.0000 | INHALATION_SPRAY | RESPIRATORY_TRACT | 2 refills | Status: DC | PRN

## 2018-05-12 MED ORDER — FLUNISOLIDE 25 MCG/ACT (0.025%) NA SOLN: NASAL | 0 refills | Status: DC

## 2018-05-12 MED ORDER — EPINEPHRINE 0.3 MG/0.3ML IJ SOAJ: INTRAMUSCULAR | 1 refills | Status: DC

## 2018-05-12 NOTE — Progress Notes (Addendum)
[place] [address] Dept: [phone]  FOLLOW UP NOTE  Patient ID: Robin Mcclain, female    DOB: [birth year]  Age: 62 y.o. MRN: [id number] Date of Office Visit: 05/12/2018  Assessment  Chief Complaint: Asthma and Allergies  HPI Robin Mcclain is a 62 year old female who presents to the clinic for a follow up visit. She was last seen in this office on 01/07/2017 by Dr. Verlin Fester for evaluation of asthma, allergic rhinitis, and persistent cough. At that time, she started montelukast once a day. At today's visit, she reports her asthma has been well controlled with occasional use of her albuterol inhaler especially with the weather change from fall to winter, pine needles, and strong perfume. Allergic rhinitis is reported as well controlled with Claritin once a day and Flunisolide as needed. She reports allergy injections are going well without problems or complications. Her current medications are listed in the chart. She reports she is going to get a flu shot this year.  Drug Allergies:  Allergies  Allergen Reactions  . Lac Bovis   . Lipitor [Atorvastatin] Other (See Comments)    Muscle pain and fatigue    Physical Exam: BP 122/76   Pulse 87   Temp 98.3 F (36.8 C) (Oral)   Resp 16   SpO2 97%    Physical Exam  Constitutional: She is oriented to person, place, and time. She appears well-developed and well-nourished.  HENT:  Head: Normocephalic.  Right Ear: External ear normal.  Left Ear: External ear normal.  Mouth/Throat: Oropharynx is clear and moist.  Bilateral nares slightly erythematous with no nasal drainage noted. Pharynx normal. Ears normal. Eyes normal.  Eyes: Conjunctivae are normal.  Neck: Normal range of motion. Neck supple.  Cardiovascular: Normal rate, regular rhythm and normal heart sounds.  No murmur noted  Pulmonary/Chest: Effort normal and breath sounds normal.  Lungs clear to auscultation  Musculoskeletal: Normal range of motion.    Neurological: She is alert and oriented to person, place, and time.  Skin: Skin is warm and dry.  Psychiatric: She has a normal mood and affect. Her behavior is normal. Judgment and thought content normal.    Diagnostics: FVC 2.37, FEV1 1.84. Predicted FVC 2.60, predicted FEV1 2.03. Spirometry is within the normal range.   Assessment and Plan: 1. Mild persistent asthma without complication   2. Seasonal allergic rhinitis due to pollen   3. Mild persistent asthma with acute exacerbation     Meds ordered this encounter  Medications  . montelukast (SINGULAIR) 10 MG tablet    Sig: Take 1 tablet (10 mg total) by mouth at bedtime.    Dispense:  30 tablet    Refill:  5  . albuterol (VENTOLIN HFA) 108 (90 Base) MCG/ACT inhaler    Sig: Inhale 2 puffs into the lungs every 4 (four) hours as needed for wheezing or shortness of breath.    Dispense:  1 Inhaler    Refill:  2  . EPINEPHrine (EPIPEN 2-PAK) 0.3 mg/0.3 mL IJ SOAJ injection    Sig: Use as directed for severe allergic reactions    Dispense:  2 Device    Refill:  1  . flunisolide (NASALIDE) 25 MCG/ACT (0.025%) SOLN    Sig: Use 1 to 2 sprays each nostril once daily for stuffy nose or drainage    Dispense:  1 Bottle    Refill:  0    Patient Instructions  Mild persistent asthma Begin montelukast 10 mg once a day  during the winter season. We can re-evaluate this at your next visit.  Continue Ventolin 2 puffs every 4 hours as needed for cough or wheeze Asthma control goals:  Full participation in all desired activities (may need albuterol before activity)  Albuterol use two time or less a week on average (not counting use with activity)  Cough interfering with sleep two time or less a month    Seasonal allergic rhinitis due to pollen Continue allergen avoidance measures Continue allergy injections once a week Claritin 10 mg once a day Flunisolide nasal spray 2 sprays in each nostril twice a day as needed for a runny  nose Consider saline nasal rinses. Use before medicated nasal sprays  Continue the other medications as listed in your chart  Call me if this treatment plan is not working well for you  Follow up in 6 months or sooner if needed   Return in about 6 months (around 11/10/2018), or if symptoms worsen or fail to improve.   Thank you for the opportunity to care for this patient.  Please do not hesitate to contact me with questions.  Gareth Morgan, FNP [facility]   _________________________________________________  I have provided oversight concerning Robin Mcclain's evaluation and treatment of this patient's health issues addressed during today's encounter.  I agree with the assessment and therapeutic plan as outlined in the note.   Signed,   R Edgar Frisk, MD

## 2018-05-12 NOTE — Patient Instructions (Addendum)
Mild persistent asthma Begin montelukast 10 mg once a day during the winter season. We can re-evaluate this at your next visit.  Continue Ventolin 2 puffs every 4 hours as needed for cough or wheeze Asthma control goals:  Full participation in all desired activities (may need albuterol before activity)  Albuterol use two time or less a week on average (not counting use with activity)  Cough interfering with sleep two time or less a month    Seasonal allergic rhinitis due to pollen Continue allergen avoidance measures Continue allergy injections once a week Claritin 10 mg once a day Flunisolide nasal spray 2 sprays in each nostril twice a day as needed for a runny nose Consider saline nasal rinses. Use before medicated nasal sprays  Continue the other medications as listed in your chart  Call me if this treatment plan is not working well for you  Follow up in 6 months or sooner if needed

## 2018-05-17 ENCOUNTER — Ambulatory Visit (INDEPENDENT_AMBULATORY_CARE_PROVIDER_SITE_OTHER): Payer: PRIVATE HEALTH INSURANCE

## 2018-05-17 DIAGNOSIS — J309 Allergic rhinitis, unspecified: Secondary | ICD-10-CM | POA: Diagnosis not present

## 2018-05-25 ENCOUNTER — Ambulatory Visit (INDEPENDENT_AMBULATORY_CARE_PROVIDER_SITE_OTHER): Payer: PRIVATE HEALTH INSURANCE | Admitting: *Deleted

## 2018-05-25 DIAGNOSIS — J309 Allergic rhinitis, unspecified: Secondary | ICD-10-CM | POA: Diagnosis not present

## 2018-06-07 ENCOUNTER — Ambulatory Visit (INDEPENDENT_AMBULATORY_CARE_PROVIDER_SITE_OTHER): Payer: PRIVATE HEALTH INSURANCE

## 2018-06-07 DIAGNOSIS — J309 Allergic rhinitis, unspecified: Secondary | ICD-10-CM | POA: Diagnosis not present

## 2018-06-23 ENCOUNTER — Ambulatory Visit (INDEPENDENT_AMBULATORY_CARE_PROVIDER_SITE_OTHER): Payer: PRIVATE HEALTH INSURANCE

## 2018-06-23 DIAGNOSIS — J309 Allergic rhinitis, unspecified: Secondary | ICD-10-CM | POA: Diagnosis not present

## 2018-07-12 ENCOUNTER — Ambulatory Visit (INDEPENDENT_AMBULATORY_CARE_PROVIDER_SITE_OTHER): Payer: PRIVATE HEALTH INSURANCE | Admitting: *Deleted

## 2018-07-12 DIAGNOSIS — J309 Allergic rhinitis, unspecified: Secondary | ICD-10-CM | POA: Diagnosis not present

## 2018-07-19 ENCOUNTER — Ambulatory Visit (INDEPENDENT_AMBULATORY_CARE_PROVIDER_SITE_OTHER): Payer: PRIVATE HEALTH INSURANCE

## 2018-07-19 DIAGNOSIS — J309 Allergic rhinitis, unspecified: Secondary | ICD-10-CM | POA: Diagnosis not present

## 2018-07-25 ENCOUNTER — Ambulatory Visit (INDEPENDENT_AMBULATORY_CARE_PROVIDER_SITE_OTHER): Payer: PRIVATE HEALTH INSURANCE | Admitting: *Deleted

## 2018-07-25 DIAGNOSIS — J309 Allergic rhinitis, unspecified: Secondary | ICD-10-CM | POA: Diagnosis not present

## 2018-07-26 NOTE — Progress Notes (Signed)
Vials exp 07-27-19

## 2018-07-28 DIAGNOSIS — J3089 Other allergic rhinitis: Secondary | ICD-10-CM | POA: Diagnosis not present

## 2018-08-03 ENCOUNTER — Ambulatory Visit (INDEPENDENT_AMBULATORY_CARE_PROVIDER_SITE_OTHER): Payer: PRIVATE HEALTH INSURANCE

## 2018-08-03 DIAGNOSIS — J309 Allergic rhinitis, unspecified: Secondary | ICD-10-CM

## 2018-08-04 ENCOUNTER — Ambulatory Visit (INDEPENDENT_AMBULATORY_CARE_PROVIDER_SITE_OTHER): Payer: PRIVATE HEALTH INSURANCE | Admitting: Allergy

## 2018-08-04 ENCOUNTER — Encounter: Payer: Self-pay | Admitting: Allergy

## 2018-08-04 VITALS — BP 106/76 | HR 69 | Temp 97.5°F | Resp 32 | Ht 64.0 in | Wt 181.4 lb

## 2018-08-04 DIAGNOSIS — J301 Allergic rhinitis due to pollen: Secondary | ICD-10-CM | POA: Diagnosis not present

## 2018-08-04 DIAGNOSIS — J4530 Mild persistent asthma, uncomplicated: Secondary | ICD-10-CM

## 2018-08-04 MED ORDER — AZELASTINE-FLUTICASONE 137-50 MCG/ACT NA SUSP: 1.0000 | Freq: Two times a day (BID) | NASAL | 5 refills | Status: DC

## 2018-08-04 MED ORDER — BUDESONIDE-FORMOTEROL FUMARATE 160-4.5 MCG/ACT IN AERO: 2.0000 | INHALATION_SPRAY | Freq: Two times a day (BID) | RESPIRATORY_TRACT | 5 refills | Status: DC

## 2018-08-04 NOTE — Patient Instructions (Addendum)
Mild persistent asthma  - Start Symbicort 131mcg 2 puffs twice a day  - Start Singulair 10mg  daily - take in the evenings  - Continue Ventolin 2 puffs every 4 hours as needed for cough or wheeze Asthma control goals:  Full participation in all desired activities (may need albuterol before activity)  Albuterol use two time or less a week on average (not counting use with activity)  Cough interfering with sleep two time or less a month  Seasonal allergic rhinitis due to pollen  - Continue allergen avoidance measures  - Continue allergy injections per schedule  - Continue Claritin 10 mg once a day  - trial dymista 1 spray each nostril twice a day.  This is a combination nasal spray with Flonase + Astelin (nasal antihistamine).  This helps with both nasal congestion and drainage.   - Consider saline nasal rinses. Use before medicated nasal sprays   Follow up in 3-4 months or sooner if needed

## 2018-08-04 NOTE — Progress Notes (Signed)
Follow-up Note  RE: Robin Mcclain MRN: [id number] DOB: Oct 12, 1955 Date of Office Visit: 08/04/2018   History of present illness: Robin Mcclain is a 62 y.o. female presenting today for sick visit.  She was last seen in the office on 05/12/18 by our NP Gareth Morgan.  She has a history of asthma and allergies.  She states that the winter time is most problematic time of year for her and asthma symptoms.  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  She will need to use her Ventolin which does relieve these symptoms thus she is using Ventolin almost daily.  She states she is improved if she is outdoors in the cool environment.  She also reports having nasal drainage in her throat and sometimes having to spit it out.    At her last visit it was advised that she start singulair however she states she has not been taking this medication daily.  It also was recommended that she start Flunisolide spray but she states she does not have this spray.  She does take claritin daily.  For her allergic rhinitis she is on immunotherapy and received injections yesterday.    Review of systems: Review of Systems  Constitutional: Negative for fever and malaise/fatigue.  HENT: Positive for congestion. Negative for ear discharge, ear pain, nosebleeds, sinus pain and sore throat.   Eyes: Negative for pain, discharge and redness.  Respiratory: Positive for shortness of breath and wheezing. Negative for cough, hemoptysis and sputum production.   Cardiovascular: Negative for chest pain.  Gastrointestinal: Negative for abdominal pain, constipation, diarrhea, heartburn, nausea and vomiting.  Musculoskeletal: Negative for joint pain.  Skin: Negative for itching and rash.  Neurological: Negative for headaches.    All other systems negative unless noted above in HPI  Past  medical/social/surgical/family history have been reviewed and are unchanged unless specifically indicated below.  No changes  Medication List: Allergies as of 08/04/2018      Reactions   Lac Bovis    Lipitor [atorvastatin] Other (See Comments)   Muscle pain and fatigue      Medication List       Accurate as of August 04, 2018 12:18 PM. Always use your most recent med list.        albuterol 108 (90 Base) MCG/ACT inhaler Commonly known as:  VENTOLIN HFA Inhale 2 puffs into the lungs every 4 (four) hours as needed for wheezing or shortness of breath.   EPINEPHrine 0.3 mg/0.3 mL Soaj injection Commonly known as:  EPIPEN 2-PAK Use as directed for severe allergic reactions   flunisolide 25 MCG/ACT (0.025%) Soln Commonly known as:  NASALIDE Use 1 to 2 sprays each nostril once daily for stuffy nose or drainage   fluticasone furoate-vilanterol 100-25 MCG/INH Aepb Commonly known as:  BREO ELLIPTA Inhale into the lungs.   glimepiride 4 MG tablet Commonly known as:  AMARYL TK 1 T PO  BID   ibuprofen 800 MG tablet Commonly known as:  ADVIL,MOTRIN Take 800 mg by mouth.   loratadine 10 MG tablet Commonly known as:  CLARITIN Take 10 mg by mouth daily.   metFORMIN 1000 MG tablet Commonly known as:  GLUCOPHAGE TAKE 1 TABLET BY MOUTH TWICE DAILY WITH MEALS   montelukast 10 MG tablet Commonly known as:  SINGULAIR Take 1 tablet (10 mg total) by mouth  at bedtime.   NON FORMULARY   olmesartan 40 MG tablet Commonly known as:  BENICAR Take 40 mg by mouth daily.   ondansetron 8 MG tablet Commonly known as:  ZOFRAN Take 8 mg by mouth.   PRECISION QID TEST test strip Generic drug:  glucose blood Checks BS 1-2 times daily   SYNTHROID 75 MCG tablet Generic drug:  levothyroxine TAKE 1 TABLET BY MOUTH DAILY       Known medication allergies: Allergies  Allergen Reactions  . Lac Bovis   . Lipitor [Atorvastatin] Other (See Comments)    Muscle pain and fatigue      Physical examination: Blood pressure 106/76, pulse 69, temperature (!) 97.5 F (36.4 C), temperature source Oral, resp. rate (!) 32, height 5\' 4"  (1.626 m), weight 181 lb 6.4 oz (82.3 kg), SpO2 97 %.  General: Alert, interactive, in no acute distress. HEENT: PERRLA, TMs pearly gray, turbinates mildly edematous with clear discharge, post-pharynx non erythematous. Neck: Supple without lymphadenopathy. Lungs: Clear to auscultation without wheezing, rhonchi or rales. {no increased work of breathing. CV: Normal S1, S2 without murmurs. Abdomen: Nondistended, nontender. Skin: Warm and dry, without lesions or rashes. Extremities:  No clubbing, cyanosis or edema. Neuro:   Grossly intact.  Diagnositics/Labs:  Spirometry: FEV1: 1.77L 88%, FVC: 2.31L 90%, ratio consistent with nonobstructive pattern  Assessment and plan:   Mild persistent asthma, not well controlled  - Start Symbicort 153mcg 2 puffs twice a day  - Start Singulair 10mg  daily - take in the evenings  - Continue Ventolin 2 puffs every 4 hours as needed for cough or wheeze Asthma control goals:  Full participation in all desired activities (may need albuterol before activity)  Albuterol use two time or less a week on average (not counting use with activity)  Cough interfering with sleep two time or less a month - goal is to come off of ICS and possibly singulair during the spring/summer when she is typically asymptomatic  Seasonal allergic rhinitis due to pollen  - Continue allergen avoidance measures  - Continue allergy injections per schedule  - Continue Claritin 10 mg once a day  - trial dymista 1 spray each nostril twice a day.  This is a combination nasal spray with Flonase + Astelin (nasal antihistamine).  This helps with both nasal congestion and drainage.   - Consider saline nasal rinses. Use before medicated nasal sprays   Follow up in 3-4 months or sooner if needed  I appreciate the opportunity to take  part in [place] care. Please do not hesitate to contact me with questions.  Sincerely,   Prudy Feeler, MD Allergy/Immunology [facility]

## 2018-08-10 ENCOUNTER — Ambulatory Visit (INDEPENDENT_AMBULATORY_CARE_PROVIDER_SITE_OTHER): Payer: PRIVATE HEALTH INSURANCE | Admitting: *Deleted

## 2018-08-10 DIAGNOSIS — J309 Allergic rhinitis, unspecified: Secondary | ICD-10-CM | POA: Diagnosis not present

## 2018-08-30 ENCOUNTER — Ambulatory Visit (INDEPENDENT_AMBULATORY_CARE_PROVIDER_SITE_OTHER): Payer: PRIVATE HEALTH INSURANCE | Admitting: *Deleted

## 2018-08-30 DIAGNOSIS — J309 Allergic rhinitis, unspecified: Secondary | ICD-10-CM | POA: Diagnosis not present

## 2018-09-15 ENCOUNTER — Ambulatory Visit (INDEPENDENT_AMBULATORY_CARE_PROVIDER_SITE_OTHER): Payer: PRIVATE HEALTH INSURANCE

## 2018-09-15 DIAGNOSIS — J309 Allergic rhinitis, unspecified: Secondary | ICD-10-CM

## 2018-09-22 ENCOUNTER — Ambulatory Visit (INDEPENDENT_AMBULATORY_CARE_PROVIDER_SITE_OTHER): Payer: PRIVATE HEALTH INSURANCE

## 2018-09-22 DIAGNOSIS — J309 Allergic rhinitis, unspecified: Secondary | ICD-10-CM | POA: Diagnosis not present

## 2018-09-28 ENCOUNTER — Ambulatory Visit (INDEPENDENT_AMBULATORY_CARE_PROVIDER_SITE_OTHER): Payer: PRIVATE HEALTH INSURANCE

## 2018-09-28 DIAGNOSIS — J309 Allergic rhinitis, unspecified: Secondary | ICD-10-CM | POA: Diagnosis not present

## 2018-10-12 ENCOUNTER — Ambulatory Visit (INDEPENDENT_AMBULATORY_CARE_PROVIDER_SITE_OTHER): Payer: PRIVATE HEALTH INSURANCE

## 2018-10-12 DIAGNOSIS — J309 Allergic rhinitis, unspecified: Secondary | ICD-10-CM

## 2018-10-19 ENCOUNTER — Ambulatory Visit (INDEPENDENT_AMBULATORY_CARE_PROVIDER_SITE_OTHER): Payer: PRIVATE HEALTH INSURANCE | Admitting: *Deleted

## 2018-10-19 DIAGNOSIS — J309 Allergic rhinitis, unspecified: Secondary | ICD-10-CM

## 2018-10-27 ENCOUNTER — Ambulatory Visit (INDEPENDENT_AMBULATORY_CARE_PROVIDER_SITE_OTHER): Payer: PRIVATE HEALTH INSURANCE

## 2018-10-27 DIAGNOSIS — J309 Allergic rhinitis, unspecified: Secondary | ICD-10-CM | POA: Diagnosis not present

## 2018-11-02 ENCOUNTER — Ambulatory Visit (INDEPENDENT_AMBULATORY_CARE_PROVIDER_SITE_OTHER): Payer: PRIVATE HEALTH INSURANCE

## 2018-11-02 DIAGNOSIS — J309 Allergic rhinitis, unspecified: Secondary | ICD-10-CM | POA: Diagnosis not present

## 2018-11-10 ENCOUNTER — Ambulatory Visit (INDEPENDENT_AMBULATORY_CARE_PROVIDER_SITE_OTHER): Payer: PRIVATE HEALTH INSURANCE

## 2018-11-10 DIAGNOSIS — J309 Allergic rhinitis, unspecified: Secondary | ICD-10-CM

## 2018-11-16 ENCOUNTER — Ambulatory Visit (INDEPENDENT_AMBULATORY_CARE_PROVIDER_SITE_OTHER): Payer: PRIVATE HEALTH INSURANCE

## 2018-11-16 DIAGNOSIS — J309 Allergic rhinitis, unspecified: Secondary | ICD-10-CM

## 2018-12-07 ENCOUNTER — Ambulatory Visit (INDEPENDENT_AMBULATORY_CARE_PROVIDER_SITE_OTHER): Payer: PRIVATE HEALTH INSURANCE

## 2018-12-07 DIAGNOSIS — J309 Allergic rhinitis, unspecified: Secondary | ICD-10-CM | POA: Diagnosis not present

## 2018-12-15 ENCOUNTER — Ambulatory Visit (INDEPENDENT_AMBULATORY_CARE_PROVIDER_SITE_OTHER): Payer: PRIVATE HEALTH INSURANCE

## 2018-12-15 DIAGNOSIS — J309 Allergic rhinitis, unspecified: Secondary | ICD-10-CM

## 2018-12-22 ENCOUNTER — Ambulatory Visit (INDEPENDENT_AMBULATORY_CARE_PROVIDER_SITE_OTHER): Payer: PRIVATE HEALTH INSURANCE

## 2018-12-22 DIAGNOSIS — J309 Allergic rhinitis, unspecified: Secondary | ICD-10-CM

## 2018-12-28 ENCOUNTER — Ambulatory Visit (INDEPENDENT_AMBULATORY_CARE_PROVIDER_SITE_OTHER): Payer: PRIVATE HEALTH INSURANCE

## 2018-12-28 DIAGNOSIS — J309 Allergic rhinitis, unspecified: Secondary | ICD-10-CM | POA: Diagnosis not present

## 2019-01-04 ENCOUNTER — Ambulatory Visit (INDEPENDENT_AMBULATORY_CARE_PROVIDER_SITE_OTHER): Payer: PRIVATE HEALTH INSURANCE

## 2019-01-04 DIAGNOSIS — J309 Allergic rhinitis, unspecified: Secondary | ICD-10-CM

## 2019-01-11 ENCOUNTER — Ambulatory Visit (INDEPENDENT_AMBULATORY_CARE_PROVIDER_SITE_OTHER): Payer: PRIVATE HEALTH INSURANCE

## 2019-01-11 DIAGNOSIS — J309 Allergic rhinitis, unspecified: Secondary | ICD-10-CM | POA: Diagnosis not present

## 2019-01-26 ENCOUNTER — Ambulatory Visit (INDEPENDENT_AMBULATORY_CARE_PROVIDER_SITE_OTHER): Payer: PRIVATE HEALTH INSURANCE

## 2019-01-26 DIAGNOSIS — J309 Allergic rhinitis, unspecified: Secondary | ICD-10-CM

## 2019-02-09 ENCOUNTER — Ambulatory Visit (INDEPENDENT_AMBULATORY_CARE_PROVIDER_SITE_OTHER): Payer: PRIVATE HEALTH INSURANCE

## 2019-02-09 DIAGNOSIS — J309 Allergic rhinitis, unspecified: Secondary | ICD-10-CM | POA: Diagnosis not present

## 2019-02-15 ENCOUNTER — Ambulatory Visit (INDEPENDENT_AMBULATORY_CARE_PROVIDER_SITE_OTHER): Payer: PRIVATE HEALTH INSURANCE

## 2019-02-15 DIAGNOSIS — J301 Allergic rhinitis due to pollen: Secondary | ICD-10-CM | POA: Diagnosis not present

## 2019-02-23 ENCOUNTER — Ambulatory Visit (INDEPENDENT_AMBULATORY_CARE_PROVIDER_SITE_OTHER): Payer: PRIVATE HEALTH INSURANCE

## 2019-02-23 DIAGNOSIS — J301 Allergic rhinitis due to pollen: Secondary | ICD-10-CM | POA: Diagnosis not present

## 2019-03-02 ENCOUNTER — Ambulatory Visit (INDEPENDENT_AMBULATORY_CARE_PROVIDER_SITE_OTHER): Payer: PRIVATE HEALTH INSURANCE

## 2019-03-02 DIAGNOSIS — J309 Allergic rhinitis, unspecified: Secondary | ICD-10-CM

## 2019-03-09 ENCOUNTER — Ambulatory Visit (INDEPENDENT_AMBULATORY_CARE_PROVIDER_SITE_OTHER): Payer: PRIVATE HEALTH INSURANCE

## 2019-03-09 DIAGNOSIS — J309 Allergic rhinitis, unspecified: Secondary | ICD-10-CM | POA: Diagnosis not present

## 2019-03-16 ENCOUNTER — Ambulatory Visit (INDEPENDENT_AMBULATORY_CARE_PROVIDER_SITE_OTHER): Payer: PRIVATE HEALTH INSURANCE

## 2019-03-16 DIAGNOSIS — J309 Allergic rhinitis, unspecified: Secondary | ICD-10-CM

## 2019-03-23 ENCOUNTER — Ambulatory Visit (INDEPENDENT_AMBULATORY_CARE_PROVIDER_SITE_OTHER): Payer: PRIVATE HEALTH INSURANCE

## 2019-03-23 DIAGNOSIS — J309 Allergic rhinitis, unspecified: Secondary | ICD-10-CM | POA: Diagnosis not present

## 2019-03-29 ENCOUNTER — Ambulatory Visit (INDEPENDENT_AMBULATORY_CARE_PROVIDER_SITE_OTHER): Payer: PRIVATE HEALTH INSURANCE

## 2019-03-29 DIAGNOSIS — J309 Allergic rhinitis, unspecified: Secondary | ICD-10-CM

## 2019-04-06 ENCOUNTER — Ambulatory Visit (INDEPENDENT_AMBULATORY_CARE_PROVIDER_SITE_OTHER): Payer: PRIVATE HEALTH INSURANCE

## 2019-04-06 DIAGNOSIS — J309 Allergic rhinitis, unspecified: Secondary | ICD-10-CM

## 2019-04-12 ENCOUNTER — Ambulatory Visit (INDEPENDENT_AMBULATORY_CARE_PROVIDER_SITE_OTHER): Payer: PRIVATE HEALTH INSURANCE

## 2019-04-12 DIAGNOSIS — J309 Allergic rhinitis, unspecified: Secondary | ICD-10-CM | POA: Diagnosis not present

## 2019-04-19 ENCOUNTER — Ambulatory Visit (INDEPENDENT_AMBULATORY_CARE_PROVIDER_SITE_OTHER): Payer: PRIVATE HEALTH INSURANCE

## 2019-04-19 DIAGNOSIS — J309 Allergic rhinitis, unspecified: Secondary | ICD-10-CM | POA: Diagnosis not present

## 2019-05-04 ENCOUNTER — Ambulatory Visit (INDEPENDENT_AMBULATORY_CARE_PROVIDER_SITE_OTHER): Payer: PRIVATE HEALTH INSURANCE

## 2019-05-04 DIAGNOSIS — J309 Allergic rhinitis, unspecified: Secondary | ICD-10-CM | POA: Diagnosis not present

## 2019-05-09 ENCOUNTER — Ambulatory Visit (INDEPENDENT_AMBULATORY_CARE_PROVIDER_SITE_OTHER): Payer: PRIVATE HEALTH INSURANCE

## 2019-05-09 DIAGNOSIS — J309 Allergic rhinitis, unspecified: Secondary | ICD-10-CM

## 2019-05-25 ENCOUNTER — Ambulatory Visit (INDEPENDENT_AMBULATORY_CARE_PROVIDER_SITE_OTHER): Payer: PRIVATE HEALTH INSURANCE

## 2019-05-25 DIAGNOSIS — J309 Allergic rhinitis, unspecified: Secondary | ICD-10-CM

## 2019-06-08 ENCOUNTER — Ambulatory Visit (INDEPENDENT_AMBULATORY_CARE_PROVIDER_SITE_OTHER): Payer: PRIVATE HEALTH INSURANCE

## 2019-06-08 DIAGNOSIS — J309 Allergic rhinitis, unspecified: Secondary | ICD-10-CM

## 2019-06-15 ENCOUNTER — Ambulatory Visit (INDEPENDENT_AMBULATORY_CARE_PROVIDER_SITE_OTHER): Payer: PRIVATE HEALTH INSURANCE

## 2019-06-15 DIAGNOSIS — J309 Allergic rhinitis, unspecified: Secondary | ICD-10-CM

## 2019-06-22 ENCOUNTER — Ambulatory Visit (INDEPENDENT_AMBULATORY_CARE_PROVIDER_SITE_OTHER): Payer: PRIVATE HEALTH INSURANCE

## 2019-06-22 DIAGNOSIS — J309 Allergic rhinitis, unspecified: Secondary | ICD-10-CM | POA: Diagnosis not present

## 2019-06-29 ENCOUNTER — Ambulatory Visit (INDEPENDENT_AMBULATORY_CARE_PROVIDER_SITE_OTHER): Payer: PRIVATE HEALTH INSURANCE

## 2019-06-29 DIAGNOSIS — J309 Allergic rhinitis, unspecified: Secondary | ICD-10-CM | POA: Diagnosis not present

## 2019-07-03 DIAGNOSIS — J301 Allergic rhinitis due to pollen: Secondary | ICD-10-CM | POA: Diagnosis not present

## 2019-07-03 NOTE — Progress Notes (Signed)
VIALS EXP 07-02-20

## 2019-07-13 ENCOUNTER — Ambulatory Visit (INDEPENDENT_AMBULATORY_CARE_PROVIDER_SITE_OTHER): Payer: PRIVATE HEALTH INSURANCE

## 2019-07-13 DIAGNOSIS — J309 Allergic rhinitis, unspecified: Secondary | ICD-10-CM

## 2019-07-17 ENCOUNTER — Ambulatory Visit (INDEPENDENT_AMBULATORY_CARE_PROVIDER_SITE_OTHER): Payer: PRIVATE HEALTH INSURANCE

## 2019-07-17 DIAGNOSIS — J309 Allergic rhinitis, unspecified: Secondary | ICD-10-CM

## 2019-08-02 ENCOUNTER — Ambulatory Visit (INDEPENDENT_AMBULATORY_CARE_PROVIDER_SITE_OTHER): Payer: PRIVATE HEALTH INSURANCE

## 2019-08-02 DIAGNOSIS — J309 Allergic rhinitis, unspecified: Secondary | ICD-10-CM | POA: Diagnosis not present

## 2019-08-03 ENCOUNTER — Other Ambulatory Visit: Payer: Self-pay | Admitting: Obstetrics and Gynecology

## 2019-08-03 DIAGNOSIS — Z1231 Encounter for screening mammogram for malignant neoplasm of breast: Secondary | ICD-10-CM

## 2019-08-14 ENCOUNTER — Ambulatory Visit (INDEPENDENT_AMBULATORY_CARE_PROVIDER_SITE_OTHER): Payer: PRIVATE HEALTH INSURANCE

## 2019-08-14 DIAGNOSIS — J309 Allergic rhinitis, unspecified: Secondary | ICD-10-CM

## 2019-08-15 NOTE — Patient Instructions (Signed)
Asthma Continue albuterol 2 puffs every 4 hours as needed for cough or wheeze You may use albuterol 2 puffs 5-15 minutes before exercise to decrease cough or wheeze For asthma flares, begin Flovent 110-2 puffs twice a day with a spacer for 2 weeks or until cough and wheeze free  Allergic rhinitis Continue Claritin 10 mg once a day as needed for a runny nose Continue azelastine 1-2 sprays in each nostril twice a day as needed for nasal symptoms Continue Flunisolide 1-2 sprays in each nostril once a day as needed for nasal symptoms Consider saline nasal rinses as needed for nasal symptoms. Use this before any medicated nasal sprays for best result Continue allergen immunotherapy and have access to EpiPen  Call the clinic if this treatment plan is not working well for you  Follow up in 6 months or sooner if needed.

## 2019-08-15 NOTE — Progress Notes (Addendum)
[place] [address] Dept: [phone]  FOLLOW UP NOTE  Patient ID: Robin Mcclain, female    DOB: 07/13/1956  Age: 63 y.o. MRN: [id number] Date of Office Visit: 08/16/2019  Assessment  Chief Complaint: Allergies  HPI Robin Mcclain is a 63 year old female who presents to the clinic for a follow up visit. She was last seen in this clinic on 08/04/2018 by Dr. Nelva Bush for evaluation of asthma and allergic rhinitis. At today's visit, she reports her asthma has been well controlled with no shortness of breath, cough, or wheeze with activity or rest. She is not currently taking montelukast or using Symbicort. 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 Allergic rhinitis is reported as well controlled with occasional clear rhinorrhea for which she takes Claritin as needed with moderate relief of symptoms. She reports allergen immunotherapy is going well and she has a significant relief of her symptoms while remaining on allergen immunotherapy. Her current medications are listed in the chart.    Drug Allergies:  Allergies  Allergen Reactions  . Lac Bovis   . Lipitor [Atorvastatin] Other (See Comments)    Muscle pain and fatigue    Physical Exam: BP 128/78   Pulse 88   Temp (!) 96 F (35.6 C) (Temporal)   Resp 16   Ht 5' 4.4" (1.636 m)   Wt 180 lb 6.4 oz (81.8 kg)   SpO2 97%   BMI 30.58 kg/m    Physical Exam Vitals reviewed.  Constitutional:      Appearance: Normal appearance.  HENT:     Head: Normocephalic and atraumatic.     Right Ear: Tympanic membrane normal.     Left Ear: Tympanic membrane normal.     Nose:     Comments: Bilateral nares slightly erythematous with no nasal drainage. Pharynx normal. Ears normal. Eyes normal.    Mouth/Throat:     Pharynx: Oropharynx is clear.  Eyes:     Conjunctiva/sclera: Conjunctivae normal.  Cardiovascular:     Rate and Rhythm: Normal rate and regular rhythm.    Heart sounds: Normal heart sounds. No murmur.  Pulmonary:     Effort: Pulmonary effort is normal.     Breath sounds: Normal breath sounds.  Musculoskeletal:        General: Normal range of motion.     Cervical back: Normal range of motion and neck supple.  Skin:    General: Skin is warm and dry.  Neurological:     Mental Status: She is alert and oriented to person, place, and time.  Psychiatric:        Mood and Affect: Mood normal.        Behavior: Behavior normal.        Thought Content: Thought content normal.        Judgment: Judgment normal.    Diagnostics: FVC 2.48, FEV1 1.86. Predicted FVC 2.54, predicted FEV1 1.98. Spirometry indicates normal ventilatory function.   Assessment and Plan: 1. Mild intermittent asthma without complication   2. Seasonal allergic rhinitis due to pollen     Meds ordered this encounter  Medications  . EPINEPHrine (EPIPEN 2-PAK) 0.3 mg/0.3 mL IJ SOAJ injection    Sig: Use as directed for severe allergic reactions    Dispense:  2 each    Refill:  1  . fluticasone (FLOVENT HFA) 110 MCG/ACT inhaler    Sig: For asthma flares, use 2 puffs twice a  day with a spacer for 2 weeks or until cough and wheeze free    Dispense:  1 Inhaler    Refill:  5    Please hold. Patient will call when needed    Patient Instructions  Asthma Continue albuterol 2 puffs every 4 hours as needed for cough or wheeze You may use albuterol 2 puffs 5-15 minutes before exercise to decrease cough or wheeze For asthma flares, begin Flovent 110-2 puffs twice a day with a spacer for 2 weeks or until cough and wheeze free  Allergic rhinitis Continue Claritin 10 mg once a day as needed for a runny nose Continue azelastine 1-2 sprays in each nostril twice a day as needed for nasal symptoms Continue Flunisolide 1-2 sprays in each nostril once a day as needed for nasal symptoms Consider saline nasal rinses as needed for nasal symptoms. Use this before any medicated nasal sprays  for best result Continue allergen immunotherapy and have access to EpiPen  Call the clinic if this treatment plan is not working well for you  Follow up in 6 months or sooner if needed.   Return in about 6 months (around 02/14/2020), or if symptoms worsen or fail to improve.    Thank you for the opportunity to care for this patient.  Please do not hesitate to contact me with questions.  Gareth Morgan, FNP [facility]  ________________________________________________  I have provided oversight concerning Robin Mcclain's evaluation and treatment of this patient's health issues addressed during today's encounter.  I agree with the assessment and therapeutic plan as outlined in the note.   Signed,   R Edgar Frisk, MD

## 2019-08-16 ENCOUNTER — Encounter: Payer: Self-pay | Admitting: Family Medicine

## 2019-08-16 ENCOUNTER — Ambulatory Visit (INDEPENDENT_AMBULATORY_CARE_PROVIDER_SITE_OTHER): Payer: PRIVATE HEALTH INSURANCE | Admitting: Family Medicine

## 2019-08-16 ENCOUNTER — Other Ambulatory Visit: Payer: Self-pay

## 2019-08-16 VITALS — BP 128/78 | HR 88 | Temp 96.0°F | Resp 16 | Ht 64.4 in | Wt 180.4 lb

## 2019-08-16 DIAGNOSIS — J4520 Mild intermittent asthma, uncomplicated: Secondary | ICD-10-CM | POA: Diagnosis not present

## 2019-08-16 DIAGNOSIS — J301 Allergic rhinitis due to pollen: Secondary | ICD-10-CM | POA: Diagnosis not present

## 2019-08-16 MED ORDER — EPINEPHRINE 0.3 MG/0.3ML IJ SOAJ: INTRAMUSCULAR | 1 refills | Status: DC

## 2019-08-16 MED ORDER — FLOVENT HFA 110 MCG/ACT IN AERO: INHALATION_SPRAY | RESPIRATORY_TRACT | 5 refills | Status: DC

## 2019-08-23 ENCOUNTER — Ambulatory Visit (INDEPENDENT_AMBULATORY_CARE_PROVIDER_SITE_OTHER): Payer: PRIVATE HEALTH INSURANCE

## 2019-08-23 DIAGNOSIS — J309 Allergic rhinitis, unspecified: Secondary | ICD-10-CM | POA: Diagnosis not present

## 2019-08-31 ENCOUNTER — Ambulatory Visit (INDEPENDENT_AMBULATORY_CARE_PROVIDER_SITE_OTHER): Payer: PRIVATE HEALTH INSURANCE

## 2019-08-31 DIAGNOSIS — J309 Allergic rhinitis, unspecified: Secondary | ICD-10-CM

## 2019-09-20 ENCOUNTER — Ambulatory Visit (INDEPENDENT_AMBULATORY_CARE_PROVIDER_SITE_OTHER): Payer: PRIVATE HEALTH INSURANCE

## 2019-09-20 DIAGNOSIS — J309 Allergic rhinitis, unspecified: Secondary | ICD-10-CM | POA: Diagnosis not present

## 2019-09-27 ENCOUNTER — Ambulatory Visit (INDEPENDENT_AMBULATORY_CARE_PROVIDER_SITE_OTHER): Payer: PRIVATE HEALTH INSURANCE

## 2019-09-27 DIAGNOSIS — J309 Allergic rhinitis, unspecified: Secondary | ICD-10-CM

## 2019-10-19 ENCOUNTER — Ambulatory Visit (INDEPENDENT_AMBULATORY_CARE_PROVIDER_SITE_OTHER): Payer: PRIVATE HEALTH INSURANCE | Admitting: *Deleted

## 2019-10-19 DIAGNOSIS — J309 Allergic rhinitis, unspecified: Secondary | ICD-10-CM | POA: Diagnosis not present

## 2019-11-02 ENCOUNTER — Ambulatory Visit (INDEPENDENT_AMBULATORY_CARE_PROVIDER_SITE_OTHER): Payer: PRIVATE HEALTH INSURANCE | Admitting: *Deleted

## 2019-11-02 DIAGNOSIS — J309 Allergic rhinitis, unspecified: Secondary | ICD-10-CM

## 2019-11-09 ENCOUNTER — Ambulatory Visit (INDEPENDENT_AMBULATORY_CARE_PROVIDER_SITE_OTHER): Payer: PRIVATE HEALTH INSURANCE | Admitting: *Deleted

## 2019-11-09 DIAGNOSIS — J309 Allergic rhinitis, unspecified: Secondary | ICD-10-CM | POA: Diagnosis not present

## 2019-11-23 ENCOUNTER — Ambulatory Visit (INDEPENDENT_AMBULATORY_CARE_PROVIDER_SITE_OTHER): Payer: PRIVATE HEALTH INSURANCE

## 2019-11-23 DIAGNOSIS — J309 Allergic rhinitis, unspecified: Secondary | ICD-10-CM

## 2019-12-14 ENCOUNTER — Ambulatory Visit (INDEPENDENT_AMBULATORY_CARE_PROVIDER_SITE_OTHER): Payer: PRIVATE HEALTH INSURANCE

## 2019-12-14 DIAGNOSIS — J309 Allergic rhinitis, unspecified: Secondary | ICD-10-CM

## 2019-12-21 ENCOUNTER — Ambulatory Visit (INDEPENDENT_AMBULATORY_CARE_PROVIDER_SITE_OTHER): Payer: PRIVATE HEALTH INSURANCE

## 2019-12-21 DIAGNOSIS — J309 Allergic rhinitis, unspecified: Secondary | ICD-10-CM | POA: Diagnosis not present

## 2019-12-28 ENCOUNTER — Ambulatory Visit (INDEPENDENT_AMBULATORY_CARE_PROVIDER_SITE_OTHER): Payer: PRIVATE HEALTH INSURANCE

## 2019-12-28 DIAGNOSIS — J309 Allergic rhinitis, unspecified: Secondary | ICD-10-CM

## 2020-01-09 ENCOUNTER — Ambulatory Visit (INDEPENDENT_AMBULATORY_CARE_PROVIDER_SITE_OTHER): Payer: PRIVATE HEALTH INSURANCE

## 2020-01-09 DIAGNOSIS — J309 Allergic rhinitis, unspecified: Secondary | ICD-10-CM

## 2020-02-22 ENCOUNTER — Ambulatory Visit (INDEPENDENT_AMBULATORY_CARE_PROVIDER_SITE_OTHER): Payer: PRIVATE HEALTH INSURANCE

## 2020-02-22 DIAGNOSIS — J309 Allergic rhinitis, unspecified: Secondary | ICD-10-CM | POA: Diagnosis not present

## 2020-02-29 ENCOUNTER — Ambulatory Visit (INDEPENDENT_AMBULATORY_CARE_PROVIDER_SITE_OTHER): Payer: PRIVATE HEALTH INSURANCE

## 2020-02-29 DIAGNOSIS — J309 Allergic rhinitis, unspecified: Secondary | ICD-10-CM

## 2020-03-05 ENCOUNTER — Ambulatory Visit (INDEPENDENT_AMBULATORY_CARE_PROVIDER_SITE_OTHER): Payer: PRIVATE HEALTH INSURANCE | Admitting: *Deleted

## 2020-03-05 DIAGNOSIS — J309 Allergic rhinitis, unspecified: Secondary | ICD-10-CM | POA: Diagnosis not present

## 2020-03-14 ENCOUNTER — Ambulatory Visit (INDEPENDENT_AMBULATORY_CARE_PROVIDER_SITE_OTHER): Payer: PRIVATE HEALTH INSURANCE

## 2020-03-14 DIAGNOSIS — J309 Allergic rhinitis, unspecified: Secondary | ICD-10-CM | POA: Diagnosis not present

## 2020-04-11 ENCOUNTER — Ambulatory Visit (INDEPENDENT_AMBULATORY_CARE_PROVIDER_SITE_OTHER): Payer: PRIVATE HEALTH INSURANCE

## 2020-04-11 DIAGNOSIS — J309 Allergic rhinitis, unspecified: Secondary | ICD-10-CM | POA: Diagnosis not present

## 2020-04-18 ENCOUNTER — Ambulatory Visit (INDEPENDENT_AMBULATORY_CARE_PROVIDER_SITE_OTHER): Payer: Medicare Other

## 2020-04-18 DIAGNOSIS — J309 Allergic rhinitis, unspecified: Secondary | ICD-10-CM | POA: Diagnosis not present

## 2020-04-25 ENCOUNTER — Ambulatory Visit (INDEPENDENT_AMBULATORY_CARE_PROVIDER_SITE_OTHER): Payer: Medicare Other

## 2020-04-25 DIAGNOSIS — J309 Allergic rhinitis, unspecified: Secondary | ICD-10-CM

## 2020-05-09 ENCOUNTER — Ambulatory Visit (INDEPENDENT_AMBULATORY_CARE_PROVIDER_SITE_OTHER): Payer: Medicare Other

## 2020-05-09 DIAGNOSIS — J309 Allergic rhinitis, unspecified: Secondary | ICD-10-CM

## 2020-05-16 ENCOUNTER — Ambulatory Visit (INDEPENDENT_AMBULATORY_CARE_PROVIDER_SITE_OTHER): Payer: Medicare Other

## 2020-05-16 DIAGNOSIS — J309 Allergic rhinitis, unspecified: Secondary | ICD-10-CM

## 2020-06-12 ENCOUNTER — Ambulatory Visit (INDEPENDENT_AMBULATORY_CARE_PROVIDER_SITE_OTHER): Payer: Medicare Other

## 2020-06-12 DIAGNOSIS — J309 Allergic rhinitis, unspecified: Secondary | ICD-10-CM | POA: Diagnosis not present

## 2020-06-18 DIAGNOSIS — J301 Allergic rhinitis due to pollen: Secondary | ICD-10-CM | POA: Diagnosis not present

## 2020-06-18 NOTE — Progress Notes (Addendum)
Vial exp 06-18-21.  Vial diluted to green.

## 2020-07-11 ENCOUNTER — Ambulatory Visit (INDEPENDENT_AMBULATORY_CARE_PROVIDER_SITE_OTHER): Payer: Medicare Other

## 2020-07-11 DIAGNOSIS — J309 Allergic rhinitis, unspecified: Secondary | ICD-10-CM | POA: Diagnosis not present

## 2020-07-17 ENCOUNTER — Ambulatory Visit (INDEPENDENT_AMBULATORY_CARE_PROVIDER_SITE_OTHER): Payer: Medicare Other

## 2020-07-17 DIAGNOSIS — J309 Allergic rhinitis, unspecified: Secondary | ICD-10-CM

## 2020-07-25 ENCOUNTER — Ambulatory Visit (INDEPENDENT_AMBULATORY_CARE_PROVIDER_SITE_OTHER): Payer: Medicare Other | Admitting: *Deleted

## 2020-07-25 DIAGNOSIS — J309 Allergic rhinitis, unspecified: Secondary | ICD-10-CM | POA: Diagnosis not present

## 2020-08-12 ENCOUNTER — Ambulatory Visit (INDEPENDENT_AMBULATORY_CARE_PROVIDER_SITE_OTHER): Payer: Medicare Other

## 2020-08-12 DIAGNOSIS — J309 Allergic rhinitis, unspecified: Secondary | ICD-10-CM | POA: Diagnosis not present

## 2020-08-29 ENCOUNTER — Ambulatory Visit (INDEPENDENT_AMBULATORY_CARE_PROVIDER_SITE_OTHER): Payer: Medicare Other

## 2020-08-29 DIAGNOSIS — J309 Allergic rhinitis, unspecified: Secondary | ICD-10-CM | POA: Diagnosis not present

## 2020-10-03 ENCOUNTER — Ambulatory Visit: Payer: Self-pay

## 2020-10-10 ENCOUNTER — Ambulatory Visit (INDEPENDENT_AMBULATORY_CARE_PROVIDER_SITE_OTHER): Payer: Medicare Other | Admitting: *Deleted

## 2020-10-10 DIAGNOSIS — J309 Allergic rhinitis, unspecified: Secondary | ICD-10-CM | POA: Diagnosis not present

## 2020-10-17 ENCOUNTER — Ambulatory Visit (INDEPENDENT_AMBULATORY_CARE_PROVIDER_SITE_OTHER): Payer: Medicare Other

## 2020-10-17 DIAGNOSIS — J309 Allergic rhinitis, unspecified: Secondary | ICD-10-CM | POA: Diagnosis not present

## 2020-10-31 ENCOUNTER — Ambulatory Visit (INDEPENDENT_AMBULATORY_CARE_PROVIDER_SITE_OTHER): Payer: Medicare Other

## 2020-10-31 DIAGNOSIS — J309 Allergic rhinitis, unspecified: Secondary | ICD-10-CM

## 2020-11-07 ENCOUNTER — Ambulatory Visit (INDEPENDENT_AMBULATORY_CARE_PROVIDER_SITE_OTHER): Payer: Medicare Other

## 2020-11-07 DIAGNOSIS — J309 Allergic rhinitis, unspecified: Secondary | ICD-10-CM | POA: Diagnosis not present

## 2020-11-13 ENCOUNTER — Ambulatory Visit (INDEPENDENT_AMBULATORY_CARE_PROVIDER_SITE_OTHER): Payer: Medicare Other

## 2020-11-13 DIAGNOSIS — J309 Allergic rhinitis, unspecified: Secondary | ICD-10-CM | POA: Diagnosis not present

## 2020-11-21 ENCOUNTER — Ambulatory Visit (INDEPENDENT_AMBULATORY_CARE_PROVIDER_SITE_OTHER): Payer: Medicare Other | Admitting: *Deleted

## 2020-11-21 DIAGNOSIS — J309 Allergic rhinitis, unspecified: Secondary | ICD-10-CM

## 2020-12-18 ENCOUNTER — Ambulatory Visit (INDEPENDENT_AMBULATORY_CARE_PROVIDER_SITE_OTHER): Payer: Medicare Other | Admitting: *Deleted

## 2020-12-18 DIAGNOSIS — J309 Allergic rhinitis, unspecified: Secondary | ICD-10-CM

## 2020-12-26 ENCOUNTER — Ambulatory Visit (INDEPENDENT_AMBULATORY_CARE_PROVIDER_SITE_OTHER): Payer: Medicare Other | Admitting: *Deleted

## 2020-12-26 DIAGNOSIS — J309 Allergic rhinitis, unspecified: Secondary | ICD-10-CM

## 2021-01-16 ENCOUNTER — Ambulatory Visit (INDEPENDENT_AMBULATORY_CARE_PROVIDER_SITE_OTHER): Payer: Medicare Other

## 2021-01-16 DIAGNOSIS — J309 Allergic rhinitis, unspecified: Secondary | ICD-10-CM | POA: Diagnosis not present

## 2021-01-28 ENCOUNTER — Ambulatory Visit (INDEPENDENT_AMBULATORY_CARE_PROVIDER_SITE_OTHER): Payer: Medicare Other

## 2021-01-28 DIAGNOSIS — J309 Allergic rhinitis, unspecified: Secondary | ICD-10-CM | POA: Diagnosis not present

## 2021-02-06 ENCOUNTER — Ambulatory Visit (INDEPENDENT_AMBULATORY_CARE_PROVIDER_SITE_OTHER): Payer: Medicare Other

## 2021-02-06 DIAGNOSIS — J309 Allergic rhinitis, unspecified: Secondary | ICD-10-CM | POA: Diagnosis not present

## 2021-04-03 ENCOUNTER — Ambulatory Visit (INDEPENDENT_AMBULATORY_CARE_PROVIDER_SITE_OTHER): Payer: Medicare Other

## 2021-04-03 DIAGNOSIS — J309 Allergic rhinitis, unspecified: Secondary | ICD-10-CM | POA: Diagnosis not present

## 2021-04-10 ENCOUNTER — Ambulatory Visit (INDEPENDENT_AMBULATORY_CARE_PROVIDER_SITE_OTHER): Payer: Medicare Other

## 2021-04-10 DIAGNOSIS — J309 Allergic rhinitis, unspecified: Secondary | ICD-10-CM

## 2021-04-24 ENCOUNTER — Ambulatory Visit (INDEPENDENT_AMBULATORY_CARE_PROVIDER_SITE_OTHER): Payer: Medicare Other

## 2021-04-24 DIAGNOSIS — J309 Allergic rhinitis, unspecified: Secondary | ICD-10-CM | POA: Diagnosis not present

## 2021-04-24 DIAGNOSIS — E7800 Pure hypercholesterolemia, unspecified: Secondary | ICD-10-CM | POA: Diagnosis not present

## 2021-04-24 DIAGNOSIS — E1169 Type 2 diabetes mellitus with other specified complication: Secondary | ICD-10-CM | POA: Diagnosis not present

## 2021-04-24 DIAGNOSIS — Z7984 Long term (current) use of oral hypoglycemic drugs: Secondary | ICD-10-CM | POA: Diagnosis not present

## 2021-04-24 DIAGNOSIS — E039 Hypothyroidism, unspecified: Secondary | ICD-10-CM | POA: Diagnosis not present

## 2021-04-24 DIAGNOSIS — I10 Essential (primary) hypertension: Secondary | ICD-10-CM | POA: Diagnosis not present

## 2021-04-24 DIAGNOSIS — Z79899 Other long term (current) drug therapy: Secondary | ICD-10-CM | POA: Diagnosis not present

## 2021-05-01 ENCOUNTER — Ambulatory Visit (INDEPENDENT_AMBULATORY_CARE_PROVIDER_SITE_OTHER): Payer: Medicare Other

## 2021-05-01 DIAGNOSIS — J309 Allergic rhinitis, unspecified: Secondary | ICD-10-CM

## 2021-05-06 DIAGNOSIS — M50123 Cervical disc disorder at C6-C7 level with radiculopathy: Secondary | ICD-10-CM | POA: Diagnosis not present

## 2021-05-06 DIAGNOSIS — M4722 Other spondylosis with radiculopathy, cervical region: Secondary | ICD-10-CM | POA: Diagnosis not present

## 2021-05-06 DIAGNOSIS — M50122 Cervical disc disorder at C5-C6 level with radiculopathy: Secondary | ICD-10-CM | POA: Diagnosis not present

## 2021-05-08 ENCOUNTER — Ambulatory Visit (INDEPENDENT_AMBULATORY_CARE_PROVIDER_SITE_OTHER): Payer: Medicare Other

## 2021-05-08 DIAGNOSIS — E1169 Type 2 diabetes mellitus with other specified complication: Secondary | ICD-10-CM | POA: Diagnosis not present

## 2021-05-08 DIAGNOSIS — J309 Allergic rhinitis, unspecified: Secondary | ICD-10-CM | POA: Diagnosis not present

## 2021-05-08 DIAGNOSIS — E7800 Pure hypercholesterolemia, unspecified: Secondary | ICD-10-CM | POA: Diagnosis not present

## 2021-05-08 DIAGNOSIS — I10 Essential (primary) hypertension: Secondary | ICD-10-CM | POA: Diagnosis not present

## 2021-05-13 DIAGNOSIS — L600 Ingrowing nail: Secondary | ICD-10-CM | POA: Diagnosis not present

## 2021-05-13 DIAGNOSIS — E119 Type 2 diabetes mellitus without complications: Secondary | ICD-10-CM | POA: Diagnosis not present

## 2021-05-21 ENCOUNTER — Ambulatory Visit (INDEPENDENT_AMBULATORY_CARE_PROVIDER_SITE_OTHER): Payer: Medicare Other

## 2021-05-21 DIAGNOSIS — J309 Allergic rhinitis, unspecified: Secondary | ICD-10-CM | POA: Diagnosis not present

## 2021-05-22 DIAGNOSIS — R142 Eructation: Secondary | ICD-10-CM | POA: Diagnosis not present

## 2021-05-22 DIAGNOSIS — Z1211 Encounter for screening for malignant neoplasm of colon: Secondary | ICD-10-CM | POA: Diagnosis not present

## 2021-05-22 DIAGNOSIS — D122 Benign neoplasm of ascending colon: Secondary | ICD-10-CM | POA: Diagnosis not present

## 2021-05-22 DIAGNOSIS — K30 Functional dyspepsia: Secondary | ICD-10-CM | POA: Diagnosis not present

## 2021-05-22 DIAGNOSIS — R1013 Epigastric pain: Secondary | ICD-10-CM | POA: Diagnosis not present

## 2021-05-22 DIAGNOSIS — K2950 Unspecified chronic gastritis without bleeding: Secondary | ICD-10-CM | POA: Diagnosis not present

## 2021-05-22 DIAGNOSIS — K3189 Other diseases of stomach and duodenum: Secondary | ICD-10-CM | POA: Diagnosis not present

## 2021-05-22 DIAGNOSIS — K635 Polyp of colon: Secondary | ICD-10-CM | POA: Diagnosis not present

## 2021-05-27 DIAGNOSIS — E119 Type 2 diabetes mellitus without complications: Secondary | ICD-10-CM | POA: Diagnosis not present

## 2021-06-17 DIAGNOSIS — K140 Glossitis: Secondary | ICD-10-CM | POA: Diagnosis not present

## 2021-06-18 DIAGNOSIS — H524 Presbyopia: Secondary | ICD-10-CM | POA: Diagnosis not present

## 2021-06-18 DIAGNOSIS — H25813 Combined forms of age-related cataract, bilateral: Secondary | ICD-10-CM | POA: Diagnosis not present

## 2021-06-18 DIAGNOSIS — H5213 Myopia, bilateral: Secondary | ICD-10-CM | POA: Diagnosis not present

## 2021-06-18 DIAGNOSIS — E113292 Type 2 diabetes mellitus with mild nonproliferative diabetic retinopathy without macular edema, left eye: Secondary | ICD-10-CM | POA: Diagnosis not present

## 2021-06-19 ENCOUNTER — Ambulatory Visit (INDEPENDENT_AMBULATORY_CARE_PROVIDER_SITE_OTHER): Payer: Medicare Other

## 2021-06-19 DIAGNOSIS — J309 Allergic rhinitis, unspecified: Secondary | ICD-10-CM

## 2021-06-26 ENCOUNTER — Ambulatory Visit (INDEPENDENT_AMBULATORY_CARE_PROVIDER_SITE_OTHER): Payer: Medicare Other

## 2021-06-26 DIAGNOSIS — J309 Allergic rhinitis, unspecified: Secondary | ICD-10-CM | POA: Diagnosis not present

## 2021-07-03 ENCOUNTER — Ambulatory Visit: Payer: Self-pay

## 2021-07-03 DIAGNOSIS — J301 Allergic rhinitis due to pollen: Secondary | ICD-10-CM | POA: Diagnosis not present

## 2021-07-03 NOTE — Progress Notes (Signed)
VIALS MADE. EXP 07-03-22

## 2021-07-10 ENCOUNTER — Ambulatory Visit (INDEPENDENT_AMBULATORY_CARE_PROVIDER_SITE_OTHER): Payer: Medicare Other

## 2021-07-10 DIAGNOSIS — J309 Allergic rhinitis, unspecified: Secondary | ICD-10-CM | POA: Diagnosis not present

## 2021-07-15 ENCOUNTER — Ambulatory Visit (INDEPENDENT_AMBULATORY_CARE_PROVIDER_SITE_OTHER): Payer: Medicare Other

## 2021-07-15 DIAGNOSIS — J309 Allergic rhinitis, unspecified: Secondary | ICD-10-CM

## 2021-07-24 ENCOUNTER — Ambulatory Visit (INDEPENDENT_AMBULATORY_CARE_PROVIDER_SITE_OTHER): Payer: Medicare Other

## 2021-07-24 DIAGNOSIS — J309 Allergic rhinitis, unspecified: Secondary | ICD-10-CM

## 2021-08-05 DIAGNOSIS — Z20822 Contact with and (suspected) exposure to covid-19: Secondary | ICD-10-CM | POA: Diagnosis not present

## 2021-09-02 ENCOUNTER — Ambulatory Visit (INDEPENDENT_AMBULATORY_CARE_PROVIDER_SITE_OTHER): Payer: Medicare Other

## 2021-09-02 DIAGNOSIS — J309 Allergic rhinitis, unspecified: Secondary | ICD-10-CM

## 2021-09-18 ENCOUNTER — Ambulatory Visit (INDEPENDENT_AMBULATORY_CARE_PROVIDER_SITE_OTHER): Payer: Medicare Other

## 2021-09-18 DIAGNOSIS — J309 Allergic rhinitis, unspecified: Secondary | ICD-10-CM

## 2021-09-25 ENCOUNTER — Ambulatory Visit (INDEPENDENT_AMBULATORY_CARE_PROVIDER_SITE_OTHER): Payer: Medicare Other

## 2021-09-25 DIAGNOSIS — J309 Allergic rhinitis, unspecified: Secondary | ICD-10-CM

## 2021-10-09 ENCOUNTER — Ambulatory Visit (INDEPENDENT_AMBULATORY_CARE_PROVIDER_SITE_OTHER): Payer: Medicare Other

## 2021-10-09 DIAGNOSIS — J309 Allergic rhinitis, unspecified: Secondary | ICD-10-CM | POA: Diagnosis not present

## 2021-10-13 DIAGNOSIS — E119 Type 2 diabetes mellitus without complications: Secondary | ICD-10-CM | POA: Diagnosis not present

## 2021-10-13 DIAGNOSIS — Z1231 Encounter for screening mammogram for malignant neoplasm of breast: Secondary | ICD-10-CM | POA: Diagnosis not present

## 2021-10-13 DIAGNOSIS — Z6824 Body mass index (BMI) 24.0-24.9, adult: Secondary | ICD-10-CM | POA: Diagnosis not present

## 2021-10-13 DIAGNOSIS — B3731 Acute candidiasis of vulva and vagina: Secondary | ICD-10-CM | POA: Diagnosis not present

## 2021-10-16 ENCOUNTER — Ambulatory Visit (INDEPENDENT_AMBULATORY_CARE_PROVIDER_SITE_OTHER): Payer: Medicare Other

## 2021-10-31 ENCOUNTER — Ambulatory Visit (INDEPENDENT_AMBULATORY_CARE_PROVIDER_SITE_OTHER): Payer: Medicare Other

## 2021-10-31 DIAGNOSIS — J309 Allergic rhinitis, unspecified: Secondary | ICD-10-CM

## 2021-11-05 ENCOUNTER — Ambulatory Visit (INDEPENDENT_AMBULATORY_CARE_PROVIDER_SITE_OTHER): Payer: Medicare Other

## 2021-11-05 DIAGNOSIS — J309 Allergic rhinitis, unspecified: Secondary | ICD-10-CM

## 2021-11-12 ENCOUNTER — Ambulatory Visit (INDEPENDENT_AMBULATORY_CARE_PROVIDER_SITE_OTHER): Payer: Medicare Other

## 2021-11-12 DIAGNOSIS — J309 Allergic rhinitis, unspecified: Secondary | ICD-10-CM | POA: Diagnosis not present

## 2021-11-20 ENCOUNTER — Ambulatory Visit (INDEPENDENT_AMBULATORY_CARE_PROVIDER_SITE_OTHER): Payer: Medicare Other

## 2021-11-20 DIAGNOSIS — J309 Allergic rhinitis, unspecified: Secondary | ICD-10-CM

## 2021-12-11 ENCOUNTER — Ambulatory Visit (INDEPENDENT_AMBULATORY_CARE_PROVIDER_SITE_OTHER): Payer: Medicare Other

## 2021-12-11 DIAGNOSIS — J309 Allergic rhinitis, unspecified: Secondary | ICD-10-CM | POA: Diagnosis not present

## 2021-12-18 ENCOUNTER — Ambulatory Visit (INDEPENDENT_AMBULATORY_CARE_PROVIDER_SITE_OTHER): Payer: Medicare Other

## 2021-12-18 DIAGNOSIS — J309 Allergic rhinitis, unspecified: Secondary | ICD-10-CM

## 2022-01-01 ENCOUNTER — Ambulatory Visit (INDEPENDENT_AMBULATORY_CARE_PROVIDER_SITE_OTHER): Payer: Medicare Other

## 2022-01-01 DIAGNOSIS — J309 Allergic rhinitis, unspecified: Secondary | ICD-10-CM

## 2022-01-07 DIAGNOSIS — Z1231 Encounter for screening mammogram for malignant neoplasm of breast: Secondary | ICD-10-CM | POA: Diagnosis not present

## 2022-01-23 ENCOUNTER — Ambulatory Visit (INDEPENDENT_AMBULATORY_CARE_PROVIDER_SITE_OTHER): Payer: Medicare Other

## 2022-01-23 DIAGNOSIS — J309 Allergic rhinitis, unspecified: Secondary | ICD-10-CM

## 2022-01-30 ENCOUNTER — Ambulatory Visit (INDEPENDENT_AMBULATORY_CARE_PROVIDER_SITE_OTHER): Payer: Medicare Other

## 2022-01-30 DIAGNOSIS — J309 Allergic rhinitis, unspecified: Secondary | ICD-10-CM | POA: Diagnosis not present

## 2022-02-17 ENCOUNTER — Ambulatory Visit (INDEPENDENT_AMBULATORY_CARE_PROVIDER_SITE_OTHER): Payer: Medicare Other

## 2022-02-17 DIAGNOSIS — J309 Allergic rhinitis, unspecified: Secondary | ICD-10-CM

## 2022-03-02 ENCOUNTER — Ambulatory Visit (INDEPENDENT_AMBULATORY_CARE_PROVIDER_SITE_OTHER): Payer: Medicare Other

## 2022-03-02 DIAGNOSIS — J309 Allergic rhinitis, unspecified: Secondary | ICD-10-CM

## 2022-03-25 ENCOUNTER — Ambulatory Visit (INDEPENDENT_AMBULATORY_CARE_PROVIDER_SITE_OTHER): Payer: Medicare Other

## 2022-03-25 DIAGNOSIS — J309 Allergic rhinitis, unspecified: Secondary | ICD-10-CM

## 2022-04-03 ENCOUNTER — Ambulatory Visit (INDEPENDENT_AMBULATORY_CARE_PROVIDER_SITE_OTHER): Payer: Medicare Other | Admitting: *Deleted

## 2022-04-03 DIAGNOSIS — J309 Allergic rhinitis, unspecified: Secondary | ICD-10-CM | POA: Diagnosis not present

## 2022-04-29 ENCOUNTER — Ambulatory Visit (INDEPENDENT_AMBULATORY_CARE_PROVIDER_SITE_OTHER): Payer: Medicare Other

## 2022-04-29 DIAGNOSIS — J309 Allergic rhinitis, unspecified: Secondary | ICD-10-CM | POA: Diagnosis not present

## 2022-05-05 ENCOUNTER — Ambulatory Visit (INDEPENDENT_AMBULATORY_CARE_PROVIDER_SITE_OTHER): Payer: Medicare Other

## 2022-05-05 DIAGNOSIS — J309 Allergic rhinitis, unspecified: Secondary | ICD-10-CM

## 2022-05-19 ENCOUNTER — Ambulatory Visit (INDEPENDENT_AMBULATORY_CARE_PROVIDER_SITE_OTHER): Payer: Medicare Other

## 2022-05-19 DIAGNOSIS — J309 Allergic rhinitis, unspecified: Secondary | ICD-10-CM | POA: Diagnosis not present

## 2022-06-01 ENCOUNTER — Ambulatory Visit (INDEPENDENT_AMBULATORY_CARE_PROVIDER_SITE_OTHER): Payer: Medicare Other

## 2022-06-01 DIAGNOSIS — J309 Allergic rhinitis, unspecified: Secondary | ICD-10-CM

## 2022-06-19 ENCOUNTER — Ambulatory Visit: Payer: Medicare Other | Admitting: Internal Medicine

## 2022-06-19 ENCOUNTER — Encounter: Payer: Self-pay | Admitting: Internal Medicine

## 2022-06-19 ENCOUNTER — Ambulatory Visit: Payer: Self-pay

## 2022-06-19 VITALS — BP 128/74 | HR 85 | Temp 98.0°F | Resp 20 | Ht 64.0 in | Wt 171.9 lb

## 2022-06-19 DIAGNOSIS — J309 Allergic rhinitis, unspecified: Secondary | ICD-10-CM

## 2022-06-19 DIAGNOSIS — H5213 Myopia, bilateral: Secondary | ICD-10-CM | POA: Diagnosis not present

## 2022-06-19 DIAGNOSIS — J4530 Mild persistent asthma, uncomplicated: Secondary | ICD-10-CM

## 2022-06-19 DIAGNOSIS — E119 Type 2 diabetes mellitus without complications: Secondary | ICD-10-CM | POA: Diagnosis not present

## 2022-06-19 DIAGNOSIS — H25813 Combined forms of age-related cataract, bilateral: Secondary | ICD-10-CM | POA: Diagnosis not present

## 2022-06-19 DIAGNOSIS — H524 Presbyopia: Secondary | ICD-10-CM | POA: Diagnosis not present

## 2022-06-19 MED ORDER — ALBUTEROL SULFATE HFA 108 (90 BASE) MCG/ACT IN AERS: 2.0000 | INHALATION_SPRAY | RESPIRATORY_TRACT | 2 refills | Status: DC | PRN

## 2022-06-19 MED ORDER — EPINEPHRINE 0.3 MG/0.3ML IJ SOAJ: INTRAMUSCULAR | 1 refills | Status: DC

## 2022-06-19 NOTE — Patient Instructions (Addendum)
Asthma Continue albuterol 2 puffs every 4 hours as needed for cough or wheeze You may use albuterol 2 puffs 5-15 minutes before exercise to decrease cough or wheeze For asthma flares, begin Flovent 110-2 puffs twice a day with a spacer for 2 weeks or until cough and wheeze free  Allergic rhinitis Continue Claritin 10 mg once a day as needed for a runny nose Consider saline nasal rinses as needed for nasal symptoms. Use this before any medicated nasal sprays for best result If not controlled, consider nasacort 2 sprays in each nostril daily as needed Continue allergen immunotherapy and have access to EpiPen  Call the clinic if this treatment plan is not working well for you  Follow up in 12 months or sooner if needed.  It was wonderful meeting you today! Thank you for letting me participate in your care. Sigurd Sos, MD [facility]

## 2022-06-19 NOTE — Progress Notes (Signed)
FOLLOW UP Date of Service/Encounter:  06/19/22   Subjective:  Robin Mcclain (DOB: 07/12/1956) is a 66 y.o. female who returns to the [facility] on 06/19/2022 in re-evaluation of the following: Asthma and allergic rhinitis History obtained from: chart review and patient.  For Review, LV was on 08/16/19  with Gareth Morgan, FNP seen for routine follow-up.  She has been on AIT since prior to 2016, receiving 1 vial of grass and trees.  She is coming weekly currently in gold vial due to inconsistency in receiving her injections.   Today presents for follow-up. Asthma-did have a flare last week while traveling after being exposed to perfumes. Heat also flares up her asthma.  She has used her rescue inhaler a few times in last week.  She feels breathing has improved.  She believes she may be coming down with a cold. Typically, she has not taken any of her allergy medications except rarely when coming in for her injections.  She is not using any nasal sprays She is doing AIT, and tolerating well.  No adverse reactions.  Has found injections beneficial. She has not used Flovent during asthma flares, but possibly would have if she had this medication. She has had 1 round of antibiotics with a steroid injection in the past year due to an illness which was not COVID or flu but caused her to have high fevers for over a week.  Allergies as of 06/19/2022       Reactions   Lipitor [atorvastatin] Other (See Comments)   Muscle pain and fatigue   Milk (cow)         Medication List        Accurate as of June 19, 2022 11:47 AM. If you have any questions, ask your nurse or doctor.          STOP taking these medications    flunisolide 25 MCG/ACT (0.025%) Soln Commonly known as: NASALIDE Stopped by: Clemon Chambers, MD   glimepiride 4 MG tablet Commonly known as: AMARYL Stopped by: Clemon Chambers, MD   ibuprofen 800 MG tablet Commonly known as: ADVIL Stopped by: Clemon Chambers, MD   montelukast 10 MG tablet Commonly known as: Singulair Stopped by: Clemon Chambers, MD   olmesartan 40 MG tablet Commonly known as: BENICAR Stopped by: Clemon Chambers, MD   Precision QID Test test strip Generic drug: glucose blood Stopped by: Clemon Chambers, MD       TAKE these medications    albuterol 108 (90 Base) MCG/ACT inhaler Commonly known as: Ventolin HFA Inhale 2 puffs into the lungs every 4 (four) hours as needed for wheezing or shortness of breath.   dicyclomine 20 MG tablet Commonly known as: BENTYL Take 20 mg by mouth every 6 (six) hours as needed.   EPINEPHrine 0.3 mg/0.3 mL Soaj injection Commonly known as: EpiPen 2-Pak Use as directed for severe allergic reactions   Flovent HFA 110 MCG/ACT inhaler Generic drug: fluticasone For asthma flares, use 2 puffs twice a day with a spacer for 2 weeks or until cough and wheeze free   fluticasone furoate-vilanterol 100-25 MCG/INH Aepb Commonly known as: BREO ELLIPTA Inhale into the lungs.   glipiZIDE 10 MG tablet Commonly known as: GLUCOTROL Take 10 mg by mouth 2 (two) times daily.   loratadine 10 MG tablet Commonly known as: CLARITIN Take 10 mg by mouth daily.   metFORMIN 500 MG 24 hr tablet Commonly known as: GLUCOPHAGE-XR Take  1,000 mg by mouth 2 (two) times daily. What changed: Another medication with the same name was removed. Continue taking this medication, and follow the directions you see here. Changed by: Clemon Chambers, MD   NON FORMULARY   olmesartan-hydrochlorothiazide 40-25 MG tablet Commonly known as: BENICAR HCT Take 1 tablet by mouth daily.   ondansetron 8 MG tablet Commonly known as: ZOFRAN Take 8 mg by mouth.   rosuvastatin 10 MG tablet Commonly known as: CRESTOR Take 10 mg by mouth daily.   Synthroid 75 MCG tablet Generic drug: levothyroxine TAKE 1 TABLET BY MOUTH DAILY       Past Medical History:  Diagnosis Date   Allergic rhinitis    Asthma    Diabetes  mellitus    Hypertension    Thyroid disease    Past Surgical History:  Procedure Laterality Date   ABDOMINAL HYSTERECTOMY     JOINT REPLACEMENT     Otherwise, there have been no changes to her past medical history, surgical history, family history, or social history.  ROS: All others negative except as noted per HPI.   Objective:  BP 128/74   Pulse 85   Temp 98 F (36.7 C) (Temporal)   Resp 20   Ht '5\' 4"'$  (1.626 m)   Wt 171 lb 14.4 oz (78 kg)   SpO2 98%   BMI 29.51 kg/m  Body mass index is 29.51 kg/m. Physical Exam: General Appearance:  Alert, cooperative, no distress, appears stated age  Head:  Normocephalic, without obvious abnormality, atraumatic  Eyes:  Conjunctiva clear, EOM's intact  Nose: Nares normal, hypertrophic turbinates, normal mucosa, no visible anterior polyps, and septum midline  Throat: Lips, tongue normal; teeth and gums normal, normal posterior oropharynx  Neck: Supple, symmetrical  Lungs:   clear to auscultation bilaterally, Respirations unlabored, no coughing  Heart:  regular rate and rhythm and no murmur, Appears well perfused  Extremities: No edema  Skin: Skin color, texture, turgor normal, no rashes or lesions on visualized portions of skin  Neurologic: No gross deficits   Spirometry:  Tracings reviewed. Her effort: Good reproducible efforts. FVC: 2.03L FEV1: 1.47L, 75% predicted FEV1/FVC ratio: 91% Interpretation: Spirometry consistent with normal pattern.  Please see scanned spirometry results for details.  Assessment/Plan   Asthma-controlled Continue albuterol 2 puffs every 4 hours as needed for cough or wheeze You may use albuterol 2 puffs 5-15 minutes before exercise to decrease cough or wheeze For asthma flares, begin Flovent 110-2 puffs twice a day with a spacer for 2 weeks or until cough and wheeze free  Allergic rhinitis-controlled Continue Claritin 10 mg once a day as needed for a runny nose Consider saline nasal rinses as  needed for nasal symptoms. Use this before any medicated nasal sprays for best result If not controlled, consider nasacort 2 sprays in each nostril daily as needed Continue allergen immunotherapy and have access to EpiPen  Call the clinic if this treatment plan is not working well for you  Sigurd Sos, MD  [facility]

## 2022-06-24 DIAGNOSIS — J302 Other seasonal allergic rhinitis: Secondary | ICD-10-CM | POA: Diagnosis not present

## 2022-06-24 NOTE — Progress Notes (Signed)
VIALS EXP 06-25-23

## 2022-07-28 ENCOUNTER — Ambulatory Visit: Payer: Self-pay

## 2022-07-31 ENCOUNTER — Ambulatory Visit (INDEPENDENT_AMBULATORY_CARE_PROVIDER_SITE_OTHER): Payer: Medicare Other

## 2022-07-31 DIAGNOSIS — J309 Allergic rhinitis, unspecified: Secondary | ICD-10-CM | POA: Diagnosis not present

## 2022-08-14 ENCOUNTER — Ambulatory Visit (INDEPENDENT_AMBULATORY_CARE_PROVIDER_SITE_OTHER): Payer: Medicare Other

## 2022-08-14 DIAGNOSIS — J309 Allergic rhinitis, unspecified: Secondary | ICD-10-CM | POA: Diagnosis not present

## 2022-08-20 ENCOUNTER — Ambulatory Visit (INDEPENDENT_AMBULATORY_CARE_PROVIDER_SITE_OTHER): Payer: Medicare Other

## 2022-08-20 DIAGNOSIS — J309 Allergic rhinitis, unspecified: Secondary | ICD-10-CM

## 2022-08-28 ENCOUNTER — Ambulatory Visit (INDEPENDENT_AMBULATORY_CARE_PROVIDER_SITE_OTHER): Payer: Medicare Other

## 2022-08-28 DIAGNOSIS — J309 Allergic rhinitis, unspecified: Secondary | ICD-10-CM | POA: Diagnosis not present

## 2022-09-04 ENCOUNTER — Ambulatory Visit (INDEPENDENT_AMBULATORY_CARE_PROVIDER_SITE_OTHER): Payer: Medicare Other

## 2022-09-04 DIAGNOSIS — J309 Allergic rhinitis, unspecified: Secondary | ICD-10-CM

## 2022-09-11 ENCOUNTER — Ambulatory Visit (INDEPENDENT_AMBULATORY_CARE_PROVIDER_SITE_OTHER): Payer: Medicare Other

## 2022-09-11 DIAGNOSIS — J309 Allergic rhinitis, unspecified: Secondary | ICD-10-CM | POA: Diagnosis not present

## 2022-09-17 ENCOUNTER — Ambulatory Visit (INDEPENDENT_AMBULATORY_CARE_PROVIDER_SITE_OTHER): Payer: Medicare Other

## 2022-09-17 DIAGNOSIS — J309 Allergic rhinitis, unspecified: Secondary | ICD-10-CM | POA: Diagnosis not present

## 2022-09-24 ENCOUNTER — Ambulatory Visit (INDEPENDENT_AMBULATORY_CARE_PROVIDER_SITE_OTHER): Payer: Medicare Other

## 2022-09-24 DIAGNOSIS — J309 Allergic rhinitis, unspecified: Secondary | ICD-10-CM

## 2022-10-02 ENCOUNTER — Ambulatory Visit (INDEPENDENT_AMBULATORY_CARE_PROVIDER_SITE_OTHER): Payer: Medicare Other

## 2022-10-02 DIAGNOSIS — J309 Allergic rhinitis, unspecified: Secondary | ICD-10-CM | POA: Diagnosis not present

## 2022-10-09 ENCOUNTER — Ambulatory Visit (INDEPENDENT_AMBULATORY_CARE_PROVIDER_SITE_OTHER): Payer: Medicare Other

## 2022-10-09 DIAGNOSIS — J309 Allergic rhinitis, unspecified: Secondary | ICD-10-CM

## 2022-10-14 ENCOUNTER — Ambulatory Visit (INDEPENDENT_AMBULATORY_CARE_PROVIDER_SITE_OTHER): Payer: Medicare Other

## 2022-10-14 DIAGNOSIS — J309 Allergic rhinitis, unspecified: Secondary | ICD-10-CM

## 2022-10-22 ENCOUNTER — Ambulatory Visit (INDEPENDENT_AMBULATORY_CARE_PROVIDER_SITE_OTHER): Payer: Medicare Other

## 2022-10-22 DIAGNOSIS — M25521 Pain in right elbow: Secondary | ICD-10-CM | POA: Diagnosis not present

## 2022-10-22 DIAGNOSIS — J309 Allergic rhinitis, unspecified: Secondary | ICD-10-CM

## 2022-10-29 ENCOUNTER — Ambulatory Visit (INDEPENDENT_AMBULATORY_CARE_PROVIDER_SITE_OTHER): Payer: Medicare Other

## 2022-10-29 DIAGNOSIS — J309 Allergic rhinitis, unspecified: Secondary | ICD-10-CM

## 2022-10-30 DIAGNOSIS — M25521 Pain in right elbow: Secondary | ICD-10-CM | POA: Diagnosis not present

## 2022-11-05 ENCOUNTER — Ambulatory Visit (INDEPENDENT_AMBULATORY_CARE_PROVIDER_SITE_OTHER): Payer: Medicare Other

## 2022-11-05 DIAGNOSIS — J309 Allergic rhinitis, unspecified: Secondary | ICD-10-CM | POA: Diagnosis not present

## 2022-11-09 DIAGNOSIS — H52201 Unspecified astigmatism, right eye: Secondary | ICD-10-CM | POA: Diagnosis not present

## 2022-11-09 DIAGNOSIS — H11153 Pinguecula, bilateral: Secondary | ICD-10-CM | POA: Diagnosis not present

## 2022-11-09 DIAGNOSIS — H25043 Posterior subcapsular polar age-related cataract, bilateral: Secondary | ICD-10-CM | POA: Diagnosis not present

## 2022-11-09 DIAGNOSIS — H25013 Cortical age-related cataract, bilateral: Secondary | ICD-10-CM | POA: Diagnosis not present

## 2022-11-09 DIAGNOSIS — H524 Presbyopia: Secondary | ICD-10-CM | POA: Diagnosis not present

## 2022-11-09 DIAGNOSIS — H18413 Arcus senilis, bilateral: Secondary | ICD-10-CM | POA: Diagnosis not present

## 2022-11-09 DIAGNOSIS — E119 Type 2 diabetes mellitus without complications: Secondary | ICD-10-CM | POA: Diagnosis not present

## 2022-11-09 DIAGNOSIS — H5213 Myopia, bilateral: Secondary | ICD-10-CM | POA: Diagnosis not present

## 2022-11-09 DIAGNOSIS — H2513 Age-related nuclear cataract, bilateral: Secondary | ICD-10-CM | POA: Diagnosis not present

## 2022-11-09 DIAGNOSIS — Z7984 Long term (current) use of oral hypoglycemic drugs: Secondary | ICD-10-CM | POA: Diagnosis not present

## 2022-11-10 ENCOUNTER — Ambulatory Visit (INDEPENDENT_AMBULATORY_CARE_PROVIDER_SITE_OTHER): Payer: Medicare Other

## 2022-11-10 DIAGNOSIS — J309 Allergic rhinitis, unspecified: Secondary | ICD-10-CM

## 2022-11-27 ENCOUNTER — Ambulatory Visit (INDEPENDENT_AMBULATORY_CARE_PROVIDER_SITE_OTHER): Payer: Medicare Other

## 2022-11-27 DIAGNOSIS — J309 Allergic rhinitis, unspecified: Secondary | ICD-10-CM | POA: Diagnosis not present

## 2022-12-03 ENCOUNTER — Ambulatory Visit (INDEPENDENT_AMBULATORY_CARE_PROVIDER_SITE_OTHER): Payer: Medicare Other

## 2022-12-03 DIAGNOSIS — J309 Allergic rhinitis, unspecified: Secondary | ICD-10-CM

## 2022-12-09 DIAGNOSIS — H25043 Posterior subcapsular polar age-related cataract, bilateral: Secondary | ICD-10-CM | POA: Diagnosis not present

## 2022-12-09 DIAGNOSIS — H5213 Myopia, bilateral: Secondary | ICD-10-CM | POA: Diagnosis not present

## 2022-12-09 DIAGNOSIS — H11153 Pinguecula, bilateral: Secondary | ICD-10-CM | POA: Diagnosis not present

## 2022-12-09 DIAGNOSIS — Z7984 Long term (current) use of oral hypoglycemic drugs: Secondary | ICD-10-CM | POA: Diagnosis not present

## 2022-12-09 DIAGNOSIS — H04123 Dry eye syndrome of bilateral lacrimal glands: Secondary | ICD-10-CM | POA: Diagnosis not present

## 2022-12-09 DIAGNOSIS — H524 Presbyopia: Secondary | ICD-10-CM | POA: Diagnosis not present

## 2022-12-09 DIAGNOSIS — H2513 Age-related nuclear cataract, bilateral: Secondary | ICD-10-CM | POA: Diagnosis not present

## 2022-12-09 DIAGNOSIS — E119 Type 2 diabetes mellitus without complications: Secondary | ICD-10-CM | POA: Diagnosis not present

## 2022-12-09 DIAGNOSIS — H25812 Combined forms of age-related cataract, left eye: Secondary | ICD-10-CM | POA: Diagnosis not present

## 2022-12-09 DIAGNOSIS — H52203 Unspecified astigmatism, bilateral: Secondary | ICD-10-CM | POA: Diagnosis not present

## 2022-12-24 ENCOUNTER — Ambulatory Visit (INDEPENDENT_AMBULATORY_CARE_PROVIDER_SITE_OTHER): Payer: Medicare Other

## 2022-12-24 DIAGNOSIS — J309 Allergic rhinitis, unspecified: Secondary | ICD-10-CM | POA: Diagnosis not present

## 2023-01-06 ENCOUNTER — Ambulatory Visit (INDEPENDENT_AMBULATORY_CARE_PROVIDER_SITE_OTHER): Payer: Medicare Other

## 2023-01-06 DIAGNOSIS — J309 Allergic rhinitis, unspecified: Secondary | ICD-10-CM | POA: Diagnosis not present

## 2023-01-08 DIAGNOSIS — M25521 Pain in right elbow: Secondary | ICD-10-CM | POA: Diagnosis not present

## 2023-01-08 DIAGNOSIS — E1169 Type 2 diabetes mellitus with other specified complication: Secondary | ICD-10-CM | POA: Diagnosis not present

## 2023-01-08 DIAGNOSIS — M659 Synovitis and tenosynovitis, unspecified: Secondary | ICD-10-CM | POA: Diagnosis not present

## 2023-01-13 ENCOUNTER — Ambulatory Visit (INDEPENDENT_AMBULATORY_CARE_PROVIDER_SITE_OTHER): Payer: Medicare Other

## 2023-01-13 DIAGNOSIS — J309 Allergic rhinitis, unspecified: Secondary | ICD-10-CM

## 2023-01-25 DIAGNOSIS — J302 Other seasonal allergic rhinitis: Secondary | ICD-10-CM | POA: Diagnosis not present

## 2023-01-25 NOTE — Progress Notes (Signed)
VIAL EXP 01-25-24 

## 2023-01-29 ENCOUNTER — Ambulatory Visit (INDEPENDENT_AMBULATORY_CARE_PROVIDER_SITE_OTHER): Payer: Medicare Other

## 2023-01-29 DIAGNOSIS — J309 Allergic rhinitis, unspecified: Secondary | ICD-10-CM

## 2023-02-02 DIAGNOSIS — M25521 Pain in right elbow: Secondary | ICD-10-CM | POA: Diagnosis not present

## 2023-02-02 DIAGNOSIS — R29898 Other symptoms and signs involving the musculoskeletal system: Secondary | ICD-10-CM | POA: Diagnosis not present

## 2023-02-02 DIAGNOSIS — M25621 Stiffness of right elbow, not elsewhere classified: Secondary | ICD-10-CM | POA: Diagnosis not present

## 2023-02-08 ENCOUNTER — Ambulatory Visit (INDEPENDENT_AMBULATORY_CARE_PROVIDER_SITE_OTHER): Payer: Medicare Other

## 2023-02-08 DIAGNOSIS — J309 Allergic rhinitis, unspecified: Secondary | ICD-10-CM | POA: Diagnosis not present

## 2023-02-18 DIAGNOSIS — M25621 Stiffness of right elbow, not elsewhere classified: Secondary | ICD-10-CM | POA: Diagnosis not present

## 2023-02-18 DIAGNOSIS — M25521 Pain in right elbow: Secondary | ICD-10-CM | POA: Diagnosis not present

## 2023-02-18 DIAGNOSIS — R29898 Other symptoms and signs involving the musculoskeletal system: Secondary | ICD-10-CM | POA: Diagnosis not present

## 2023-02-22 DIAGNOSIS — M25621 Stiffness of right elbow, not elsewhere classified: Secondary | ICD-10-CM | POA: Diagnosis not present

## 2023-02-22 DIAGNOSIS — R29898 Other symptoms and signs involving the musculoskeletal system: Secondary | ICD-10-CM | POA: Diagnosis not present

## 2023-02-22 DIAGNOSIS — M25521 Pain in right elbow: Secondary | ICD-10-CM | POA: Diagnosis not present

## 2023-02-24 DIAGNOSIS — R29898 Other symptoms and signs involving the musculoskeletal system: Secondary | ICD-10-CM | POA: Diagnosis not present

## 2023-02-24 DIAGNOSIS — M25621 Stiffness of right elbow, not elsewhere classified: Secondary | ICD-10-CM | POA: Diagnosis not present

## 2023-02-24 DIAGNOSIS — M25521 Pain in right elbow: Secondary | ICD-10-CM | POA: Diagnosis not present

## 2023-02-26 DIAGNOSIS — M659 Synovitis and tenosynovitis, unspecified: Secondary | ICD-10-CM | POA: Diagnosis not present

## 2023-03-01 DIAGNOSIS — M25621 Stiffness of right elbow, not elsewhere classified: Secondary | ICD-10-CM | POA: Diagnosis not present

## 2023-03-01 DIAGNOSIS — R29898 Other symptoms and signs involving the musculoskeletal system: Secondary | ICD-10-CM | POA: Diagnosis not present

## 2023-03-01 DIAGNOSIS — M25521 Pain in right elbow: Secondary | ICD-10-CM | POA: Diagnosis not present

## 2023-03-29 ENCOUNTER — Ambulatory Visit (INDEPENDENT_AMBULATORY_CARE_PROVIDER_SITE_OTHER): Payer: Medicare Other

## 2023-03-29 DIAGNOSIS — M25621 Stiffness of right elbow, not elsewhere classified: Secondary | ICD-10-CM | POA: Diagnosis not present

## 2023-03-29 DIAGNOSIS — J309 Allergic rhinitis, unspecified: Secondary | ICD-10-CM | POA: Diagnosis not present

## 2023-03-29 DIAGNOSIS — R29898 Other symptoms and signs involving the musculoskeletal system: Secondary | ICD-10-CM | POA: Diagnosis not present

## 2023-03-29 DIAGNOSIS — M25521 Pain in right elbow: Secondary | ICD-10-CM | POA: Diagnosis not present

## 2023-03-31 DIAGNOSIS — R29898 Other symptoms and signs involving the musculoskeletal system: Secondary | ICD-10-CM | POA: Diagnosis not present

## 2023-03-31 DIAGNOSIS — M25621 Stiffness of right elbow, not elsewhere classified: Secondary | ICD-10-CM | POA: Diagnosis not present

## 2023-03-31 DIAGNOSIS — M25521 Pain in right elbow: Secondary | ICD-10-CM | POA: Diagnosis not present

## 2023-04-12 DIAGNOSIS — L601 Onycholysis: Secondary | ICD-10-CM | POA: Diagnosis not present

## 2023-04-12 DIAGNOSIS — M25621 Stiffness of right elbow, not elsewhere classified: Secondary | ICD-10-CM | POA: Diagnosis not present

## 2023-04-12 DIAGNOSIS — R29898 Other symptoms and signs involving the musculoskeletal system: Secondary | ICD-10-CM | POA: Diagnosis not present

## 2023-04-12 DIAGNOSIS — L249 Irritant contact dermatitis, unspecified cause: Secondary | ICD-10-CM | POA: Diagnosis not present

## 2023-04-12 DIAGNOSIS — M25521 Pain in right elbow: Secondary | ICD-10-CM | POA: Diagnosis not present

## 2023-04-12 DIAGNOSIS — L280 Lichen simplex chronicus: Secondary | ICD-10-CM | POA: Diagnosis not present

## 2023-04-15 DIAGNOSIS — R29898 Other symptoms and signs involving the musculoskeletal system: Secondary | ICD-10-CM | POA: Diagnosis not present

## 2023-04-15 DIAGNOSIS — M25621 Stiffness of right elbow, not elsewhere classified: Secondary | ICD-10-CM | POA: Diagnosis not present

## 2023-04-15 DIAGNOSIS — M25521 Pain in right elbow: Secondary | ICD-10-CM | POA: Diagnosis not present

## 2023-04-16 ENCOUNTER — Ambulatory Visit (INDEPENDENT_AMBULATORY_CARE_PROVIDER_SITE_OTHER): Payer: Medicare Other

## 2023-04-16 DIAGNOSIS — J309 Allergic rhinitis, unspecified: Secondary | ICD-10-CM | POA: Diagnosis not present

## 2023-04-20 ENCOUNTER — Ambulatory Visit (INDEPENDENT_AMBULATORY_CARE_PROVIDER_SITE_OTHER): Payer: Medicare Other

## 2023-04-20 DIAGNOSIS — J309 Allergic rhinitis, unspecified: Secondary | ICD-10-CM

## 2023-04-21 DIAGNOSIS — E039 Hypothyroidism, unspecified: Secondary | ICD-10-CM | POA: Diagnosis not present

## 2023-04-21 DIAGNOSIS — M542 Cervicalgia: Secondary | ICD-10-CM | POA: Diagnosis not present

## 2023-04-21 DIAGNOSIS — E1169 Type 2 diabetes mellitus with other specified complication: Secondary | ICD-10-CM | POA: Diagnosis not present

## 2023-04-21 DIAGNOSIS — E119 Type 2 diabetes mellitus without complications: Secondary | ICD-10-CM | POA: Diagnosis not present

## 2023-04-21 DIAGNOSIS — Z9181 History of falling: Secondary | ICD-10-CM | POA: Diagnosis not present

## 2023-04-21 DIAGNOSIS — I10 Essential (primary) hypertension: Secondary | ICD-10-CM | POA: Diagnosis not present

## 2023-04-21 DIAGNOSIS — E7800 Pure hypercholesterolemia, unspecified: Secondary | ICD-10-CM | POA: Diagnosis not present

## 2023-04-23 DIAGNOSIS — M25521 Pain in right elbow: Secondary | ICD-10-CM | POA: Diagnosis not present

## 2023-04-23 DIAGNOSIS — M25621 Stiffness of right elbow, not elsewhere classified: Secondary | ICD-10-CM | POA: Diagnosis not present

## 2023-04-23 DIAGNOSIS — R29898 Other symptoms and signs involving the musculoskeletal system: Secondary | ICD-10-CM | POA: Diagnosis not present

## 2023-04-29 ENCOUNTER — Ambulatory Visit (INDEPENDENT_AMBULATORY_CARE_PROVIDER_SITE_OTHER): Payer: Medicare Other

## 2023-04-29 DIAGNOSIS — J309 Allergic rhinitis, unspecified: Secondary | ICD-10-CM | POA: Diagnosis not present

## 2023-05-04 ENCOUNTER — Ambulatory Visit (INDEPENDENT_AMBULATORY_CARE_PROVIDER_SITE_OTHER): Payer: Medicare Other

## 2023-05-04 DIAGNOSIS — J309 Allergic rhinitis, unspecified: Secondary | ICD-10-CM | POA: Diagnosis not present

## 2023-05-05 DIAGNOSIS — M25521 Pain in right elbow: Secondary | ICD-10-CM | POA: Diagnosis not present

## 2023-05-05 DIAGNOSIS — M25621 Stiffness of right elbow, not elsewhere classified: Secondary | ICD-10-CM | POA: Diagnosis not present

## 2023-05-05 DIAGNOSIS — R29898 Other symptoms and signs involving the musculoskeletal system: Secondary | ICD-10-CM | POA: Diagnosis not present

## 2023-05-12 ENCOUNTER — Ambulatory Visit (INDEPENDENT_AMBULATORY_CARE_PROVIDER_SITE_OTHER): Payer: Medicare Other

## 2023-05-12 DIAGNOSIS — J309 Allergic rhinitis, unspecified: Secondary | ICD-10-CM

## 2023-05-17 DIAGNOSIS — E119 Type 2 diabetes mellitus without complications: Secondary | ICD-10-CM | POA: Diagnosis not present

## 2023-05-17 DIAGNOSIS — M25521 Pain in right elbow: Secondary | ICD-10-CM | POA: Diagnosis not present

## 2023-05-17 DIAGNOSIS — E1169 Type 2 diabetes mellitus with other specified complication: Secondary | ICD-10-CM | POA: Diagnosis not present

## 2023-05-17 DIAGNOSIS — Z23 Encounter for immunization: Secondary | ICD-10-CM | POA: Diagnosis not present

## 2023-05-17 DIAGNOSIS — R29898 Other symptoms and signs involving the musculoskeletal system: Secondary | ICD-10-CM | POA: Diagnosis not present

## 2023-05-17 DIAGNOSIS — M25621 Stiffness of right elbow, not elsewhere classified: Secondary | ICD-10-CM | POA: Diagnosis not present

## 2023-05-18 DIAGNOSIS — H2512 Age-related nuclear cataract, left eye: Secondary | ICD-10-CM | POA: Diagnosis not present

## 2023-05-18 DIAGNOSIS — H25812 Combined forms of age-related cataract, left eye: Secondary | ICD-10-CM | POA: Diagnosis not present

## 2023-05-24 DIAGNOSIS — H25811 Combined forms of age-related cataract, right eye: Secondary | ICD-10-CM | POA: Diagnosis not present

## 2023-05-24 DIAGNOSIS — M25521 Pain in right elbow: Secondary | ICD-10-CM | POA: Diagnosis not present

## 2023-05-24 DIAGNOSIS — M25621 Stiffness of right elbow, not elsewhere classified: Secondary | ICD-10-CM | POA: Diagnosis not present

## 2023-05-24 DIAGNOSIS — R29898 Other symptoms and signs involving the musculoskeletal system: Secondary | ICD-10-CM | POA: Diagnosis not present

## 2023-05-26 ENCOUNTER — Encounter: Payer: Self-pay | Admitting: Internal Medicine

## 2023-05-26 ENCOUNTER — Other Ambulatory Visit: Payer: Self-pay

## 2023-05-26 ENCOUNTER — Ambulatory Visit (INDEPENDENT_AMBULATORY_CARE_PROVIDER_SITE_OTHER): Payer: Medicare Other | Admitting: Internal Medicine

## 2023-05-26 VITALS — BP 130/72 | HR 97 | Temp 97.7°F | Resp 18 | Wt 174.2 lb

## 2023-05-26 DIAGNOSIS — J4530 Mild persistent asthma, uncomplicated: Secondary | ICD-10-CM | POA: Diagnosis not present

## 2023-05-26 DIAGNOSIS — J301 Allergic rhinitis due to pollen: Secondary | ICD-10-CM

## 2023-05-26 DIAGNOSIS — G44221 Chronic tension-type headache, intractable: Secondary | ICD-10-CM

## 2023-05-26 MED ORDER — ALBUTEROL SULFATE HFA 108 (90 BASE) MCG/ACT IN AERS: 2.0000 | INHALATION_SPRAY | RESPIRATORY_TRACT | 2 refills | Status: DC | PRN

## 2023-05-26 MED ORDER — FLUTICASONE PROPIONATE HFA 110 MCG/ACT IN AERO: INHALATION_SPRAY | RESPIRATORY_TRACT | 5 refills | Status: DC

## 2023-05-26 MED ORDER — EPINEPHRINE 0.3 MG/0.3ML IJ SOAJ: INTRAMUSCULAR | 1 refills | Status: DC

## 2023-05-26 NOTE — Patient Instructions (Addendum)
Asthma Continue albuterol 2 puffs every 4 hours as needed for cough or wheeze You may use albuterol 2 puffs 5-15 minutes before exercise to decrease cough or wheeze For asthma flares, begin Flovent 110-2 puffs twice a day with a spacer for 2 weeks or until cough and wheeze free  Allergic rhinitis Consider Zyrtec 10 mg or Allegra 180 mg or Xyzal 5 mg daily in place of Claritin. Consider saline nasal rinses as needed for nasal symptoms. Use this before any medicated nasal sprays for best result Start nasacort 2 sprays in each nostril daily as needed Continue allergen immunotherapy and have access to EpiPen  Headaches New onset, frontal headaches, possibly related to allergies. No other allergy symptoms reported. Adequate hydration and sleep reported. No relief with Claritin. -Try ibuprofen, Aleve, or Tylenol for pain relief. -Switch from Claritin to Allegra, Zyrtec, or Xyzal. -Start Nasacort or Nasonex nasal spray. -If no improvement, consult with primary care provider for further evaluation.   Call the clinic if this treatment plan is not working well for you  Follow up in 12 months or sooner if needed.  It was wonderful seeing you again today! Thank you for letting me participate in your care. Tonny Bollman, MD [facility]

## 2023-05-26 NOTE — Progress Notes (Signed)
FOLLOW UP Date of Service/Encounter:  05/26/23  Subjective:  Robin Mcclain (DOB: 05/13/1956) is a 67 y.o. female who [facility]/2023 in re-evaluation of the following: asthma and allergic rhinitis History obtained from: chart review and patient.  For Review, LV was on 06/19/22  with Dr.Abdurrahman Petersheim seen for routine follow-up. See below for summary of history and diagnostics.   Therapeutic plans/changes recommended: Reported 1 round of antibiotics with steroid injection in the past year.  FEV1 75%.  Continued on allergen immunotherapy.  Continued on albuterol as needed ----------------------------------------------------- Pertinent History/Diagnostics:  Asthma: Intermittent asthma. Using albuterol PRN and flovent in block therapy. Allergic Rhinitis:  She has been on AIT since prior to 2016, receiving 1 vial of grass and trees.  She is coming weekly currently in gold vial due to inconsistency in receiving her injections.  --------------------------------------------------- Today presents for follow-up. Discussed the use of AI scribe software for clinical note transcription with the patient, who gave verbal consent to proceed.  History of Present Illness   The patient, with a history of allergies, presents with persistent headaches, which she describes as pressure-like, located in the forehead and radiating downwards. The headaches are more pronounced in the morning upon waking and in the evening. The patient suspects these headaches might be allergy-related, noting a possible correlation with weather changes and the current ragweed season. She has been managing these headaches with Claritin, but has not found significant relief.  The patient denies any other allergy symptoms such as runny nose or congestion, and her breathing has been generally good. She has not needed her albuterol inhaler regularly, only occasionally during temperature changes in winter  or on an airplane. The patient has not required any antibiotics or steroids for breathing or sinus infections in the past year.  The patient also reports a fall that resulted in elbow pain, for which she received a steroid injection and underwent physical therapy. She has since recovered and can fully extend her arm.  The patient maintains a regular eating schedule and tries to stay hydrated. She reports getting adequate sleep, although her sleep schedule is somewhat irregular due to early morning wake-ups for schoolwork. Despite these lifestyle habits, the headaches persist.      Chart Review: Her last injection was on 05/12/2023 and she was given 0.25 mL of her red vial.  She had not yet reached maintenance due to inconsistencies in regular injections though has been coming more consistently since her last visit.  All medications reviewed by clinical staff and updated in chart. No new pertinent medical or surgical history except as noted in HPI.  ROS: All others negative except as noted per HPI.   Objective:  BP 130/72 (BP Location: Left Arm, Patient Position: Sitting, Cuff Size: Normal)   Pulse 97   Temp 97.7 F (36.5 C) (Temporal)   Resp 18   Wt 174 lb 3.2 oz (79 kg)   BMI 29.90 kg/m  Body mass index is 29.9 kg/m. Physical Exam: General Appearance:  Alert, cooperative, no distress, appears stated age  Head:  Normocephalic, without obvious abnormality, atraumatic  Eyes:  Conjunctiva clear, EOM's intact  Ears EACs normal bilaterally and normal TMs bilaterally  Nose: Nares normal, normal mucosa and no visible anterior polyps  Throat: Lips, tongue normal; teeth and gums normal, normal posterior oropharynx  Neck: Supple, symmetrical  Lungs:   clear to auscultation bilaterally, Respirations unlabored, no coughing  Heart:  regular rate and rhythm and no  murmur, Appears well perfused  Extremities: No edema  Skin: Skin color, texture, turgor normal and no rashes or lesions on  visualized portions of skin  Neurologic: No gross deficits   Labs:  Lab Orders  No laboratory test(s) ordered today    Spirometry:  Tracings reviewed. Her effort: Good reproducible efforts. FVC: 1.94L FEV1: 1.56L, 81% predicted FEV1/FVC ratio: 0.80 Interpretation: Spirometry consistent with normal pattern.  Please see scanned spirometry results for details.   Assessment/Plan   Asthma No regular need for albuterol. No recent need for antibiotics or steroids for respiratory issues. Lung function tests normal. Continue albuterol 2 puffs every 4 hours as needed for cough or wheeze You may use albuterol 2 puffs 5-15 minutes before exercise to decrease cough or wheeze For asthma flares, begin Flovent 110-2 puffs twice a day with a spacer for 2 weeks or until cough and wheeze free  Allergic rhinitis On allergen immunotherapy, not yet at matinenance dose. No other allergy symptoms reported. Consider Zyrtec 10 mg or Allegra 180 mg or Xyzal 5 mg daily in place of Claritin. Consider saline nasal rinses as needed for nasal symptoms. Use this before any medicated nasal sprays for best result Start nasacort 2 sprays in each nostril daily as needed Continue allergen immunotherapy and have access to EpiPen  Headaches New onset, frontal headaches, possibly related to allergies. No other allergy symptoms reported. Adequate hydration and sleep reported. No relief with Claritin. -Try ibuprofen, Aleve, or Tylenol for pain relief. -Switch from Claritin to Allegra, Zyrtec, or Xyzal. -Start Nasacort or Nasonex nasal spray. -If no improvement, consult with primary care provider for further evaluation.   Call the clinic if this treatment plan is not working well for you  Follow up in 12 months or sooner if needed.  It was wonderful seeing you again today! Thank you for letting me participate in your care.  Other: allergy injection given in clinic today  Tonny Bollman, MD  [facility]

## 2023-06-11 ENCOUNTER — Ambulatory Visit (INDEPENDENT_AMBULATORY_CARE_PROVIDER_SITE_OTHER): Payer: Medicare Other

## 2023-06-11 DIAGNOSIS — J309 Allergic rhinitis, unspecified: Secondary | ICD-10-CM | POA: Diagnosis not present

## 2023-06-17 DIAGNOSIS — H2511 Age-related nuclear cataract, right eye: Secondary | ICD-10-CM | POA: Diagnosis not present

## 2023-06-17 DIAGNOSIS — H25811 Combined forms of age-related cataract, right eye: Secondary | ICD-10-CM | POA: Diagnosis not present

## 2023-06-21 DIAGNOSIS — L259 Unspecified contact dermatitis, unspecified cause: Secondary | ICD-10-CM | POA: Diagnosis not present

## 2023-06-24 DIAGNOSIS — L209 Atopic dermatitis, unspecified: Secondary | ICD-10-CM | POA: Diagnosis not present

## 2023-06-24 DIAGNOSIS — L280 Lichen simplex chronicus: Secondary | ICD-10-CM | POA: Diagnosis not present

## 2023-07-01 ENCOUNTER — Ambulatory Visit (INDEPENDENT_AMBULATORY_CARE_PROVIDER_SITE_OTHER): Payer: Medicare Other

## 2023-07-01 DIAGNOSIS — L603 Nail dystrophy: Secondary | ICD-10-CM | POA: Diagnosis not present

## 2023-07-01 DIAGNOSIS — J309 Allergic rhinitis, unspecified: Secondary | ICD-10-CM

## 2023-07-01 DIAGNOSIS — B351 Tinea unguium: Secondary | ICD-10-CM | POA: Diagnosis not present

## 2023-07-01 DIAGNOSIS — S90211A Contusion of right great toe with damage to nail, initial encounter: Secondary | ICD-10-CM | POA: Diagnosis not present

## 2023-07-09 ENCOUNTER — Ambulatory Visit (INDEPENDENT_AMBULATORY_CARE_PROVIDER_SITE_OTHER): Payer: Medicare Other

## 2023-07-09 DIAGNOSIS — J309 Allergic rhinitis, unspecified: Secondary | ICD-10-CM | POA: Diagnosis not present

## 2023-07-14 DIAGNOSIS — L249 Irritant contact dermatitis, unspecified cause: Secondary | ICD-10-CM | POA: Diagnosis not present

## 2023-07-16 ENCOUNTER — Ambulatory Visit (INDEPENDENT_AMBULATORY_CARE_PROVIDER_SITE_OTHER): Payer: Medicare Other | Admitting: *Deleted

## 2023-07-16 DIAGNOSIS — J309 Allergic rhinitis, unspecified: Secondary | ICD-10-CM | POA: Diagnosis not present

## 2023-07-28 ENCOUNTER — Ambulatory Visit (INDEPENDENT_AMBULATORY_CARE_PROVIDER_SITE_OTHER): Payer: Medicare Other

## 2023-07-28 DIAGNOSIS — J309 Allergic rhinitis, unspecified: Secondary | ICD-10-CM | POA: Diagnosis not present

## 2023-07-28 DIAGNOSIS — B351 Tinea unguium: Secondary | ICD-10-CM | POA: Diagnosis not present

## 2023-07-28 DIAGNOSIS — L603 Nail dystrophy: Secondary | ICD-10-CM | POA: Diagnosis not present

## 2023-07-28 DIAGNOSIS — L600 Ingrowing nail: Secondary | ICD-10-CM | POA: Diagnosis not present

## 2023-08-12 ENCOUNTER — Ambulatory Visit (INDEPENDENT_AMBULATORY_CARE_PROVIDER_SITE_OTHER): Payer: Medicare Other

## 2023-08-12 DIAGNOSIS — J309 Allergic rhinitis, unspecified: Secondary | ICD-10-CM | POA: Diagnosis not present

## 2023-09-17 ENCOUNTER — Ambulatory Visit (INDEPENDENT_AMBULATORY_CARE_PROVIDER_SITE_OTHER): Payer: Medicare Other

## 2023-09-17 DIAGNOSIS — J309 Allergic rhinitis, unspecified: Secondary | ICD-10-CM

## 2023-09-23 ENCOUNTER — Ambulatory Visit (INDEPENDENT_AMBULATORY_CARE_PROVIDER_SITE_OTHER): Payer: Medicare Other

## 2023-09-23 DIAGNOSIS — J309 Allergic rhinitis, unspecified: Secondary | ICD-10-CM

## 2023-10-01 ENCOUNTER — Ambulatory Visit (INDEPENDENT_AMBULATORY_CARE_PROVIDER_SITE_OTHER): Payer: Medicare Other

## 2023-10-01 DIAGNOSIS — J309 Allergic rhinitis, unspecified: Secondary | ICD-10-CM

## 2023-10-07 ENCOUNTER — Ambulatory Visit (INDEPENDENT_AMBULATORY_CARE_PROVIDER_SITE_OTHER): Payer: Medicare Other

## 2023-10-07 DIAGNOSIS — J309 Allergic rhinitis, unspecified: Secondary | ICD-10-CM

## 2023-10-20 ENCOUNTER — Ambulatory Visit (INDEPENDENT_AMBULATORY_CARE_PROVIDER_SITE_OTHER): Payer: Medicare Other

## 2023-10-20 DIAGNOSIS — J309 Allergic rhinitis, unspecified: Secondary | ICD-10-CM | POA: Diagnosis not present

## 2023-10-27 ENCOUNTER — Ambulatory Visit (INDEPENDENT_AMBULATORY_CARE_PROVIDER_SITE_OTHER)

## 2023-10-27 DIAGNOSIS — J309 Allergic rhinitis, unspecified: Secondary | ICD-10-CM | POA: Diagnosis not present

## 2023-11-03 DIAGNOSIS — B351 Tinea unguium: Secondary | ICD-10-CM | POA: Diagnosis not present

## 2023-11-03 DIAGNOSIS — L84 Corns and callosities: Secondary | ICD-10-CM | POA: Diagnosis not present

## 2023-11-03 DIAGNOSIS — Z9889 Other specified postprocedural states: Secondary | ICD-10-CM | POA: Diagnosis not present

## 2023-11-08 DIAGNOSIS — J302 Other seasonal allergic rhinitis: Secondary | ICD-10-CM | POA: Diagnosis not present

## 2023-11-08 NOTE — Progress Notes (Signed)
 VIAL MADE 11-08-23. EXP 11-07-24

## 2023-11-17 ENCOUNTER — Ambulatory Visit (INDEPENDENT_AMBULATORY_CARE_PROVIDER_SITE_OTHER)

## 2023-11-17 DIAGNOSIS — J309 Allergic rhinitis, unspecified: Secondary | ICD-10-CM | POA: Diagnosis not present

## 2023-11-24 ENCOUNTER — Ambulatory Visit (INDEPENDENT_AMBULATORY_CARE_PROVIDER_SITE_OTHER)

## 2023-11-24 DIAGNOSIS — J309 Allergic rhinitis, unspecified: Secondary | ICD-10-CM | POA: Diagnosis not present

## 2023-11-30 DIAGNOSIS — Z1231 Encounter for screening mammogram for malignant neoplasm of breast: Secondary | ICD-10-CM | POA: Diagnosis not present

## 2023-11-30 DIAGNOSIS — R92323 Mammographic fibroglandular density, bilateral breasts: Secondary | ICD-10-CM | POA: Diagnosis not present

## 2023-12-02 ENCOUNTER — Ambulatory Visit (INDEPENDENT_AMBULATORY_CARE_PROVIDER_SITE_OTHER)

## 2023-12-02 DIAGNOSIS — J309 Allergic rhinitis, unspecified: Secondary | ICD-10-CM

## 2023-12-16 ENCOUNTER — Ambulatory Visit (INDEPENDENT_AMBULATORY_CARE_PROVIDER_SITE_OTHER): Payer: Self-pay

## 2023-12-16 DIAGNOSIS — J309 Allergic rhinitis, unspecified: Secondary | ICD-10-CM

## 2023-12-20 DIAGNOSIS — H903 Sensorineural hearing loss, bilateral: Secondary | ICD-10-CM | POA: Diagnosis not present

## 2023-12-20 DIAGNOSIS — H6123 Impacted cerumen, bilateral: Secondary | ICD-10-CM | POA: Diagnosis not present

## 2024-01-05 DIAGNOSIS — D508 Other iron deficiency anemias: Secondary | ICD-10-CM | POA: Diagnosis not present

## 2024-01-05 DIAGNOSIS — L249 Irritant contact dermatitis, unspecified cause: Secondary | ICD-10-CM | POA: Diagnosis not present

## 2024-01-06 ENCOUNTER — Ambulatory Visit (INDEPENDENT_AMBULATORY_CARE_PROVIDER_SITE_OTHER)

## 2024-01-06 DIAGNOSIS — J309 Allergic rhinitis, unspecified: Secondary | ICD-10-CM

## 2024-01-12 ENCOUNTER — Ambulatory Visit (INDEPENDENT_AMBULATORY_CARE_PROVIDER_SITE_OTHER)

## 2024-01-12 DIAGNOSIS — J309 Allergic rhinitis, unspecified: Secondary | ICD-10-CM

## 2024-01-19 ENCOUNTER — Ambulatory Visit (INDEPENDENT_AMBULATORY_CARE_PROVIDER_SITE_OTHER)

## 2024-01-19 DIAGNOSIS — J309 Allergic rhinitis, unspecified: Secondary | ICD-10-CM | POA: Diagnosis not present

## 2024-01-20 DIAGNOSIS — E1169 Type 2 diabetes mellitus with other specified complication: Secondary | ICD-10-CM | POA: Diagnosis not present

## 2024-01-20 DIAGNOSIS — E039 Hypothyroidism, unspecified: Secondary | ICD-10-CM | POA: Diagnosis not present

## 2024-01-20 DIAGNOSIS — I10 Essential (primary) hypertension: Secondary | ICD-10-CM | POA: Diagnosis not present

## 2024-01-20 DIAGNOSIS — E7800 Pure hypercholesterolemia, unspecified: Secondary | ICD-10-CM | POA: Diagnosis not present

## 2024-01-20 DIAGNOSIS — R1310 Dysphagia, unspecified: Secondary | ICD-10-CM | POA: Diagnosis not present

## 2024-01-21 ENCOUNTER — Other Ambulatory Visit: Payer: Self-pay | Admitting: Internal Medicine

## 2024-01-21 DIAGNOSIS — R1310 Dysphagia, unspecified: Secondary | ICD-10-CM

## 2024-01-24 ENCOUNTER — Ambulatory Visit (INDEPENDENT_AMBULATORY_CARE_PROVIDER_SITE_OTHER)

## 2024-01-24 ENCOUNTER — Other Ambulatory Visit: Payer: Self-pay

## 2024-01-24 DIAGNOSIS — H524 Presbyopia: Secondary | ICD-10-CM | POA: Diagnosis not present

## 2024-01-24 DIAGNOSIS — H18413 Arcus senilis, bilateral: Secondary | ICD-10-CM | POA: Diagnosis not present

## 2024-01-24 DIAGNOSIS — H52203 Unspecified astigmatism, bilateral: Secondary | ICD-10-CM | POA: Diagnosis not present

## 2024-01-24 DIAGNOSIS — Z794 Long term (current) use of insulin: Secondary | ICD-10-CM | POA: Diagnosis not present

## 2024-01-24 DIAGNOSIS — H11153 Pinguecula, bilateral: Secondary | ICD-10-CM | POA: Diagnosis not present

## 2024-01-24 DIAGNOSIS — H5203 Hypermetropia, bilateral: Secondary | ICD-10-CM | POA: Diagnosis not present

## 2024-01-24 DIAGNOSIS — J309 Allergic rhinitis, unspecified: Secondary | ICD-10-CM

## 2024-01-24 DIAGNOSIS — Z961 Presence of intraocular lens: Secondary | ICD-10-CM | POA: Diagnosis not present

## 2024-01-24 DIAGNOSIS — E119 Type 2 diabetes mellitus without complications: Secondary | ICD-10-CM | POA: Diagnosis not present

## 2024-02-02 DIAGNOSIS — E7800 Pure hypercholesterolemia, unspecified: Secondary | ICD-10-CM | POA: Diagnosis not present

## 2024-02-02 DIAGNOSIS — R1310 Dysphagia, unspecified: Secondary | ICD-10-CM | POA: Diagnosis not present

## 2024-02-02 DIAGNOSIS — E1169 Type 2 diabetes mellitus with other specified complication: Secondary | ICD-10-CM | POA: Diagnosis not present

## 2024-02-02 DIAGNOSIS — I10 Essential (primary) hypertension: Secondary | ICD-10-CM | POA: Diagnosis not present

## 2024-02-02 DIAGNOSIS — E039 Hypothyroidism, unspecified: Secondary | ICD-10-CM | POA: Diagnosis not present

## 2024-02-03 ENCOUNTER — Other Ambulatory Visit: Payer: Self-pay | Admitting: Internal Medicine

## 2024-02-03 DIAGNOSIS — R1310 Dysphagia, unspecified: Secondary | ICD-10-CM

## 2024-02-22 DIAGNOSIS — E119 Type 2 diabetes mellitus without complications: Secondary | ICD-10-CM | POA: Diagnosis not present

## 2024-02-29 DIAGNOSIS — I10 Essential (primary) hypertension: Secondary | ICD-10-CM | POA: Diagnosis not present

## 2024-02-29 DIAGNOSIS — E1169 Type 2 diabetes mellitus with other specified complication: Secondary | ICD-10-CM | POA: Diagnosis not present

## 2024-02-29 DIAGNOSIS — E7800 Pure hypercholesterolemia, unspecified: Secondary | ICD-10-CM | POA: Diagnosis not present

## 2024-02-29 DIAGNOSIS — E039 Hypothyroidism, unspecified: Secondary | ICD-10-CM | POA: Diagnosis not present

## 2024-03-03 ENCOUNTER — Ambulatory Visit (INDEPENDENT_AMBULATORY_CARE_PROVIDER_SITE_OTHER)

## 2024-03-03 DIAGNOSIS — J309 Allergic rhinitis, unspecified: Secondary | ICD-10-CM | POA: Diagnosis not present

## 2024-03-14 DIAGNOSIS — E119 Type 2 diabetes mellitus without complications: Secondary | ICD-10-CM | POA: Diagnosis not present

## 2024-03-16 DIAGNOSIS — B351 Tinea unguium: Secondary | ICD-10-CM | POA: Diagnosis not present

## 2024-03-16 DIAGNOSIS — L851 Acquired keratosis [keratoderma] palmaris et plantaris: Secondary | ICD-10-CM | POA: Diagnosis not present

## 2024-03-17 ENCOUNTER — Ambulatory Visit (INDEPENDENT_AMBULATORY_CARE_PROVIDER_SITE_OTHER): Payer: Self-pay

## 2024-03-17 DIAGNOSIS — J309 Allergic rhinitis, unspecified: Secondary | ICD-10-CM | POA: Diagnosis not present

## 2024-03-23 DIAGNOSIS — E7800 Pure hypercholesterolemia, unspecified: Secondary | ICD-10-CM | POA: Diagnosis not present

## 2024-03-23 DIAGNOSIS — I10 Essential (primary) hypertension: Secondary | ICD-10-CM | POA: Diagnosis not present

## 2024-03-23 DIAGNOSIS — E1169 Type 2 diabetes mellitus with other specified complication: Secondary | ICD-10-CM | POA: Diagnosis not present

## 2024-03-23 DIAGNOSIS — E039 Hypothyroidism, unspecified: Secondary | ICD-10-CM | POA: Diagnosis not present

## 2024-03-31 ENCOUNTER — Ambulatory Visit (INDEPENDENT_AMBULATORY_CARE_PROVIDER_SITE_OTHER): Payer: Self-pay

## 2024-03-31 DIAGNOSIS — J309 Allergic rhinitis, unspecified: Secondary | ICD-10-CM | POA: Diagnosis not present

## 2024-04-10 DIAGNOSIS — I10 Essential (primary) hypertension: Secondary | ICD-10-CM | POA: Diagnosis not present

## 2024-04-10 DIAGNOSIS — E7800 Pure hypercholesterolemia, unspecified: Secondary | ICD-10-CM | POA: Diagnosis not present

## 2024-04-10 DIAGNOSIS — E1169 Type 2 diabetes mellitus with other specified complication: Secondary | ICD-10-CM | POA: Diagnosis not present

## 2024-04-14 ENCOUNTER — Ambulatory Visit (INDEPENDENT_AMBULATORY_CARE_PROVIDER_SITE_OTHER)

## 2024-04-14 DIAGNOSIS — J309 Allergic rhinitis, unspecified: Secondary | ICD-10-CM | POA: Diagnosis not present

## 2024-04-21 ENCOUNTER — Ambulatory Visit (INDEPENDENT_AMBULATORY_CARE_PROVIDER_SITE_OTHER)

## 2024-04-21 DIAGNOSIS — J309 Allergic rhinitis, unspecified: Secondary | ICD-10-CM

## 2024-04-23 DIAGNOSIS — E039 Hypothyroidism, unspecified: Secondary | ICD-10-CM | POA: Diagnosis not present

## 2024-04-23 DIAGNOSIS — E1169 Type 2 diabetes mellitus with other specified complication: Secondary | ICD-10-CM | POA: Diagnosis not present

## 2024-04-23 DIAGNOSIS — E7800 Pure hypercholesterolemia, unspecified: Secondary | ICD-10-CM | POA: Diagnosis not present

## 2024-04-23 DIAGNOSIS — I10 Essential (primary) hypertension: Secondary | ICD-10-CM | POA: Diagnosis not present

## 2024-04-28 ENCOUNTER — Ambulatory Visit (INDEPENDENT_AMBULATORY_CARE_PROVIDER_SITE_OTHER)

## 2024-04-28 DIAGNOSIS — J309 Allergic rhinitis, unspecified: Secondary | ICD-10-CM

## 2024-05-04 ENCOUNTER — Ambulatory Visit (INDEPENDENT_AMBULATORY_CARE_PROVIDER_SITE_OTHER)

## 2024-05-04 DIAGNOSIS — J309 Allergic rhinitis, unspecified: Secondary | ICD-10-CM

## 2024-05-15 DIAGNOSIS — R109 Unspecified abdominal pain: Secondary | ICD-10-CM | POA: Diagnosis not present

## 2024-05-15 DIAGNOSIS — K5900 Constipation, unspecified: Secondary | ICD-10-CM | POA: Diagnosis not present

## 2024-05-23 DIAGNOSIS — E039 Hypothyroidism, unspecified: Secondary | ICD-10-CM | POA: Diagnosis not present

## 2024-05-23 DIAGNOSIS — E7800 Pure hypercholesterolemia, unspecified: Secondary | ICD-10-CM | POA: Diagnosis not present

## 2024-05-23 DIAGNOSIS — E1169 Type 2 diabetes mellitus with other specified complication: Secondary | ICD-10-CM | POA: Diagnosis not present

## 2024-05-23 DIAGNOSIS — I10 Essential (primary) hypertension: Secondary | ICD-10-CM | POA: Diagnosis not present

## 2024-05-30 ENCOUNTER — Ambulatory Visit (INDEPENDENT_AMBULATORY_CARE_PROVIDER_SITE_OTHER)

## 2024-05-30 DIAGNOSIS — J309 Allergic rhinitis, unspecified: Secondary | ICD-10-CM

## 2024-06-15 DIAGNOSIS — R2681 Unsteadiness on feet: Secondary | ICD-10-CM | POA: Diagnosis not present

## 2024-06-16 DIAGNOSIS — J302 Other seasonal allergic rhinitis: Secondary | ICD-10-CM | POA: Diagnosis not present

## 2024-06-16 DIAGNOSIS — M25552 Pain in left hip: Secondary | ICD-10-CM | POA: Diagnosis not present

## 2024-06-16 NOTE — Progress Notes (Signed)
 VIAL MADE ON 06/16/24

## 2024-06-28 ENCOUNTER — Ambulatory Visit (INDEPENDENT_AMBULATORY_CARE_PROVIDER_SITE_OTHER)

## 2024-06-28 DIAGNOSIS — J302 Other seasonal allergic rhinitis: Secondary | ICD-10-CM | POA: Diagnosis not present

## 2024-06-28 DIAGNOSIS — J309 Allergic rhinitis, unspecified: Secondary | ICD-10-CM

## 2024-07-05 NOTE — Progress Notes (Addendum)
 Robin Mcclain                                          MRN: [id number]   08/29/2024   The [facility] Quality Team Specialist reviewed this patient medical record for the purposes of chart review for care gap closure. The following were reviewed: chart review for care gap closure-glycemic status assessment.    [facility]BCI Quality Team

## 2024-07-27 ENCOUNTER — Ambulatory Visit

## 2024-07-27 DIAGNOSIS — J302 Other seasonal allergic rhinitis: Secondary | ICD-10-CM | POA: Diagnosis not present

## 2024-07-27 DIAGNOSIS — J309 Allergic rhinitis, unspecified: Secondary | ICD-10-CM

## 2024-07-30 NOTE — Patient Instructions (Incomplete)
 Asthma Continue albuterol  2 puffs every 4 hours as needed for cough or wheeze You may use albuterol  2 puffs 5-15 minutes before exercise to decrease cough or wheeze For asthma flares, begin Flovent  110-2 puffs twice a day with a spacer for 2 weeks or until cough and wheeze free  Asthma control goals:  Full participation in all desired activities (may need albuterol  before activity) Albuterol  use two time or less a week on average (not counting use with activity) Cough interfering with sleep two time or less a month Oral steroids no more than once a year No hospitalizations   Allergic rhinitis Consider Zyrtec 10 mg or Allegra 180 mg or Xyzal 5 mg daily  Consider saline nasal rinses as needed for nasal symptoms. Use this before any medicated nasal sprays for best result Continuenasacort 2 sprays in each nostril daily as needed Continue allergen immunotherapy and have access to EpiPen   Headaches Past history: New onset, frontal headaches, possibly related to allergies. No other allergy symptoms reported. Adequate hydration and sleep reported. No relief with Claritin. -Try ibuprofen , Aleve, or Tylenol  for pain relief. - Continue Allegra, Zyrtec, or Xyzal. -Continue Nasacort or Nasonex nasal spray. -If no improvement, consult with primary care provider for further evaluation.   Call the clinic if this treatment plan is not working well for you  Follow up in 12 months or sooner if needed.

## 2024-07-31 ENCOUNTER — Ambulatory Visit: Admitting: Family

## 2024-07-31 ENCOUNTER — Other Ambulatory Visit: Payer: Self-pay

## 2024-07-31 ENCOUNTER — Encounter: Payer: Self-pay | Admitting: Family

## 2024-07-31 VITALS — BP 126/68 | HR 99 | Temp 98.1°F | Resp 18 | Wt 173.1 lb

## 2024-07-31 DIAGNOSIS — J301 Allergic rhinitis due to pollen: Secondary | ICD-10-CM

## 2024-07-31 DIAGNOSIS — J4530 Mild persistent asthma, uncomplicated: Secondary | ICD-10-CM

## 2024-07-31 MED ORDER — ALBUTEROL SULFATE HFA 108 (90 BASE) MCG/ACT IN AERS: 2.0000 | INHALATION_SPRAY | RESPIRATORY_TRACT | 1 refills | Status: AC | PRN

## 2024-07-31 MED ORDER — FLUTICASONE PROPIONATE HFA 110 MCG/ACT IN AERO: INHALATION_SPRAY | RESPIRATORY_TRACT | 5 refills | Status: AC

## 2024-07-31 MED ORDER — EPINEPHRINE 0.3 MG/0.3ML IJ SOAJ: INTRAMUSCULAR | 1 refills | Status: AC

## 2024-07-31 NOTE — Progress Notes (Signed)
 [address] [place] [address] Dept: [phone]  FOLLOW UP NOTE  Patient ID: Robin Mcclain, female    DOB: [birth year]  Age: 68 y.o. MRN: [id number] Date of Office Visit: 07/31/2024  Assessment  Chief Complaint: Follow-up (Yearly check in.  injections working well.asthma no issues spring is worst season.)  HPI Robin Mcclain is a 68 year old female who presents today for follow-up of asthma, allergic rhinitis, and headaches.  She was last seen on May 26, 2023 by Dr. Marinda.  She denies any new diagnosis or surgery since her last office visit.  Asthma: She denies cough, wheeze, tightness in chest, shortness of breath, and nocturnal awakenings due to breathing problems.  Since her last office visit she has received 2 rounds of steroids 1 for vertigo and the other 1 for bursitis in her hip.  She has not made any trips to the emergency room or urgent care due to breathing problems.  She does not use her albuterol  that often and has not used fluticasone  110 mcg for asthma flare since we last saw her.  She does notice that when she is in [place] in and out shopping and that that can cause her asthma to flare and also strong perfumes.  Allergic rhinitis: She reports every morning for the past 2 weeks approximately when she blows her nose she will see a little bit of blood.  This occurs in the morning only.  She is blowing her nose only because she feels something in there.  She denies rhinorrhea, nasal congestion, postnasal drip.  She denies sinus pressure, sinus tenderness, fever, chills.  She takes Claritin as needed and has not used any steroid nasal sprays in a while.  She continues to receive allergen immunotherapy per protocol.  She denies any problems or reactions with her allergy injections.  Her allergy injections do help.  She reports that she is no longer having bad headaches.   Drug Allergies:  Allergies  Allergen Reactions   Lipitor [Atorvastatin] Other (See Comments)     Muscle pain and fatigue   Milk (Cow)     Review of Systems: Negative except as per HPI   Physical Exam: BP 126/68   Pulse 99   Temp 98.1 F (36.7 C) (Temporal)   Resp 18   Wt 173 lb 1.6 oz (78.5 kg)   SpO2 100%   BMI 29.71 kg/m    Physical Exam Constitutional:      Appearance: Normal appearance.  HENT:     Head: Normocephalic and atraumatic.     Comments: Pharynx normal, eyes normal, ears normal, nose: Irritation noted in right nostril    Right Ear: Tympanic membrane, ear canal and external ear normal.     Left Ear: Tympanic membrane, ear canal and external ear normal.     Mouth/Throat:     Mouth: Mucous membranes are moist.     Pharynx: Oropharynx is clear.  Eyes:     Conjunctiva/sclera: Conjunctivae normal.  Cardiovascular:     Rate and Rhythm: Regular rhythm.     Heart sounds: Normal heart sounds.  Pulmonary:     Effort: Pulmonary effort is normal.     Breath sounds: Normal breath sounds.     Comments: Lungs clear to auscultation Musculoskeletal:     Cervical back: Neck supple.  Skin:    General: Skin is warm.  Neurological:     Mental Status: She is alert and oriented to person, place, and time.  Psychiatric:  Mood and Affect: Mood normal.        Behavior: Behavior normal.        Thought Content: Thought content normal.        Judgment: Judgment normal.     Diagnostics: FVC 2.31 L (94%), FEV1 1.83 L (96%), FEV1/FVC 0.79.  Spirometry indicates normal spirometry.  Assessment and Plan: 1. Seasonal allergic rhinitis due to pollen   2. Mild persistent asthma without complication     Meds ordered this encounter  Medications   EPINEPHrine  (EPIPEN  2-PAK) 0.3 mg/0.3 mL IJ SOAJ injection    Sig: Use as directed for severe allergic reactions    Dispense:  2 each    Refill:  1   fluticasone  (FLOVENT  HFA) 110 MCG/ACT inhaler    Sig: For asthma flares, use 2 puffs twice a day with a spacer for 2 weeks or until cough and wheeze free    Dispense:  1 each     Refill:  5    Please hold. Patient will call when needed   albuterol  (VENTOLIN  HFA) 108 (90 Base) MCG/ACT inhaler    Sig: Inhale 2 puffs into the lungs every 4 (four) hours as needed for wheezing or shortness of breath.    Dispense:  1 each    Refill:  1    Patient Instructions  Asthma-controlled Continue albuterol  2 puffs every 4 hours as needed for cough or wheeze You may use albuterol  2 puffs 5-15 minutes before exercise to decrease cough or wheeze For asthma flares, begin Flovent  110-2 puffs twice a day with a spacer for 2 weeks or until cough and wheeze free  Asthma control goals:  Full participation in all desired activities (may need albuterol  before activity) Albuterol  use two time or less a week on average (not counting use with activity) Cough interfering with sleep two time or less a month Oral steroids no more than once a year No hospitalizations   Allergic rhinitis Continue Claritin 10 mg daily Consider saline nasal rinses as needed for nasal symptoms. Use this before any medicated nasal sprays for best result May use nasacort 2 sprays in each nostril daily as needed. Hold off on using this medication for the next 5-7 days due to the irritation noted in your right nostril Continue allergen immunotherapy and have access to EpiPen  - Verbally discussed to hold off from blowing her nose in the morning and that she can use saline gel in her nostrils.  Please let us  know if you continue to see blood when you blow your nose in the morning and we may refer you to ear nose and throat.  Headaches - Much better  Call the clinic if this treatment plan is not working well for you  Follow up in 6 months or sooner if needed.    Return in about 6 months (around 01/29/2025), or if symptoms worsen or fail to improve.    Thank you for the opportunity to care for this patient.  Please do not hesitate to contact me with questions.  Wanda Craze, FNP [facility] 

## 2024-07-31 NOTE — Patient Instructions (Addendum)
 Asthma-controlled Continue albuterol  2 puffs every 4 hours as needed for cough or wheeze You may use albuterol  2 puffs 5-15 minutes before exercise to decrease cough or wheeze For asthma flares, begin Flovent  110-2 puffs twice a day with a spacer for 2 weeks or until cough and wheeze free  Asthma control goals:  Full participation in all desired activities (may need albuterol  before activity) Albuterol  use two time or less a week on average (not counting use with activity) Cough interfering with sleep two time or less a month Oral steroids no more than once a year No hospitalizations   Allergic rhinitis Continue Claritin 10 mg daily Consider saline nasal rinses as needed for nasal symptoms. Use this before any medicated nasal sprays for best result May use nasacort 2 sprays in each nostril daily as needed. Hold off on using this medication for the next 5-7 days due to the irritation noted in your right nostril Continue allergen immunotherapy and have access to EpiPen  - Verbally discussed to hold off from blowing her nose in the morning and that she can use saline gel in her nostrils.  Please let us  know if you continue to see blood when you blow your nose in the morning and we may refer you to ear nose and throat.  Headaches - Much better  Call the clinic if this treatment plan is not working well for you  Follow up in 6 months or sooner if needed.

## 2024-09-03 ENCOUNTER — Other Ambulatory Visit (HOSPITAL_BASED_OUTPATIENT_CLINIC_OR_DEPARTMENT_OTHER): Payer: Self-pay | Admitting: Internal Medicine

## 2024-09-03 DIAGNOSIS — E2839 Other primary ovarian failure: Secondary | ICD-10-CM

## 2024-09-06 ENCOUNTER — Ambulatory Visit (INDEPENDENT_AMBULATORY_CARE_PROVIDER_SITE_OTHER)

## 2024-09-06 DIAGNOSIS — J302 Other seasonal allergic rhinitis: Secondary | ICD-10-CM

## 2024-09-13 ENCOUNTER — Ambulatory Visit

## 2024-09-13 DIAGNOSIS — J302 Other seasonal allergic rhinitis: Secondary | ICD-10-CM

## 2024-09-20 ENCOUNTER — Ambulatory Visit

## 2024-09-20 DIAGNOSIS — J302 Other seasonal allergic rhinitis: Secondary | ICD-10-CM | POA: Diagnosis not present

## 2024-09-22 ENCOUNTER — Encounter: Payer: Self-pay | Admitting: *Deleted

## 2024-09-22 NOTE — Progress Notes (Signed)
 Kaylia Winborne                                          MRN: [id number]   09/22/2024   The [facility]I Quality Team Specialist reviewed this patient medical record for the purposes of chart review for care gap closure. The following were reviewed: chart review for care gap closure-glycemic status assessment.    VBCI Quality Team

## 2025-01-29 ENCOUNTER — Ambulatory Visit: Admitting: Family
